# Patient Record
Sex: Female | Born: 1935 | Race: White | Hispanic: No | Smoking: Never smoker
Health system: Southern US, Community
[De-identification: ages and names within clinical notes are randomized; demographics above are authoritative.]

## PROBLEM LIST (undated history)

## (undated) DIAGNOSIS — F419 Anxiety disorder, unspecified: Secondary | ICD-10-CM

## (undated) DIAGNOSIS — E785 Hyperlipidemia, unspecified: Secondary | ICD-10-CM

## (undated) DIAGNOSIS — D649 Anemia, unspecified: Secondary | ICD-10-CM

## (undated) DIAGNOSIS — K838 Other specified diseases of biliary tract: Secondary | ICD-10-CM

## (undated) DIAGNOSIS — K8020 Calculus of gallbladder without cholecystitis without obstruction: Secondary | ICD-10-CM

## (undated) DIAGNOSIS — T380X5A Adverse effect of glucocorticoids and synthetic analogues, initial encounter: Secondary | ICD-10-CM

## (undated) DIAGNOSIS — K219 Gastro-esophageal reflux disease without esophagitis: Secondary | ICD-10-CM

## (undated) DIAGNOSIS — I10 Essential (primary) hypertension: Secondary | ICD-10-CM

## (undated) DIAGNOSIS — G4730 Sleep apnea, unspecified: Secondary | ICD-10-CM

## (undated) DIAGNOSIS — K900 Celiac disease: Secondary | ICD-10-CM

## (undated) DIAGNOSIS — H409 Unspecified glaucoma: Secondary | ICD-10-CM

## (undated) DIAGNOSIS — J449 Chronic obstructive pulmonary disease, unspecified: Secondary | ICD-10-CM

## (undated) DIAGNOSIS — F32A Depression, unspecified: Secondary | ICD-10-CM

## (undated) DIAGNOSIS — K859 Acute pancreatitis without necrosis or infection, unspecified: Secondary | ICD-10-CM

## (undated) DIAGNOSIS — M1990 Unspecified osteoarthritis, unspecified site: Secondary | ICD-10-CM

## (undated) DIAGNOSIS — F329 Major depressive disorder, single episode, unspecified: Secondary | ICD-10-CM

## (undated) DIAGNOSIS — J302 Other seasonal allergic rhinitis: Secondary | ICD-10-CM

## (undated) HISTORY — PX: APPENDECTOMY: SHX54

## (undated) HISTORY — PX: HERNIA REPAIR: SHX51

## (undated) HISTORY — DX: Hyperlipidemia, unspecified: E78.5

## (undated) HISTORY — DX: Other seasonal allergic rhinitis: J30.2

## (undated) HISTORY — PX: CATARACT EXTRACTION, BILATERAL: SHX1313

## (undated) HISTORY — DX: Adverse effect of glucocorticoids and synthetic analogues, initial encounter: T38.0X5A

## (undated) HISTORY — DX: Depression, unspecified: F32.A

## (undated) HISTORY — DX: Major depressive disorder, single episode, unspecified: F32.9

## (undated) HISTORY — PX: TOTAL HIP ARTHROPLASTY: SHX124

## (undated) HISTORY — PX: JOINT REPLACEMENT: SHX530

## (undated) HISTORY — DX: Celiac disease: K90.0

## (undated) HISTORY — DX: Gastro-esophageal reflux disease without esophagitis: K21.9

## (undated) HISTORY — DX: Essential (primary) hypertension: I10

---

## 2010-12-29 HISTORY — PX: UPPER GASTROINTESTINAL ENDOSCOPY: SHX188

## 2010-12-29 HISTORY — PX: COLONOSCOPY: SHX174

## 2013-09-10 ENCOUNTER — Ambulatory Visit (INDEPENDENT_AMBULATORY_CARE_PROVIDER_SITE_OTHER): Payer: Medicare Other | Admitting: Internal Medicine

## 2013-09-10 ENCOUNTER — Encounter (INDEPENDENT_AMBULATORY_CARE_PROVIDER_SITE_OTHER): Payer: Self-pay | Admitting: Internal Medicine

## 2013-09-10 ENCOUNTER — Other Ambulatory Visit (INDEPENDENT_AMBULATORY_CARE_PROVIDER_SITE_OTHER): Payer: Self-pay | Admitting: *Deleted

## 2013-09-10 VITALS — BP 120/70 | HR 72 | Temp 98.1°F | Resp 18 | Ht 65.0 in | Wt 121.4 lb

## 2013-09-10 DIAGNOSIS — K900 Celiac disease: Secondary | ICD-10-CM | POA: Insufficient documentation

## 2013-09-10 DIAGNOSIS — Z860100 Personal history of colon polyps, unspecified: Secondary | ICD-10-CM | POA: Insufficient documentation

## 2013-09-10 DIAGNOSIS — M810 Age-related osteoporosis without current pathological fracture: Secondary | ICD-10-CM | POA: Insufficient documentation

## 2013-09-10 DIAGNOSIS — K838 Other specified diseases of biliary tract: Secondary | ICD-10-CM

## 2013-09-10 DIAGNOSIS — K219 Gastro-esophageal reflux disease without esophagitis: Secondary | ICD-10-CM | POA: Insufficient documentation

## 2013-09-10 DIAGNOSIS — J302 Other seasonal allergic rhinitis: Secondary | ICD-10-CM | POA: Insufficient documentation

## 2013-09-10 DIAGNOSIS — I10 Essential (primary) hypertension: Secondary | ICD-10-CM | POA: Insufficient documentation

## 2013-09-10 DIAGNOSIS — E785 Hyperlipidemia, unspecified: Secondary | ICD-10-CM | POA: Insufficient documentation

## 2013-09-10 DIAGNOSIS — F32A Depression, unspecified: Secondary | ICD-10-CM | POA: Insufficient documentation

## 2013-09-10 DIAGNOSIS — F329 Major depressive disorder, single episode, unspecified: Secondary | ICD-10-CM | POA: Insufficient documentation

## 2013-09-10 DIAGNOSIS — K8020 Calculus of gallbladder without cholecystitis without obstruction: Secondary | ICD-10-CM

## 2013-09-10 NOTE — Progress Notes (Signed)
Presenting complaint;  Dilated biliary system and cholelithiasis.  History of present illness;  Patient is an 80 year old Caucasian female who is referred through courtesy of Dr. Ree KidaJack Spainhour for possible ERCP. Patient is accompanied by her daughter Lupita LeashDonna. Patient states she has not felt valve for the last 6 months. She has had daily nausea underachievement on ondansetron. She also has noted anorexia and 9 pound weight loss. Most of the time she's been force-feeding herself but she has never vomited but experienced upper abdominal pain fever chills bedtime she has noted dark orange color to her urine. She was evaluated by Dr. Sabino DickSpanhour and associates and found to have cholelithiasis and dilated CBD and CHD. She was further evaluated with MRCP which revealed markedly dilated CHD and mildly dilated CBD with focal mural thickening. No filling defects are identified. With these findings it was felt that she would need an ERCP and this visit was arranged. Patient has chronic GERD. She states her heartburn is well controlled with therapy. She has history of celiac disease and has been on gluten-free diet and has normal stools. She complains of intermittent postural lightheadedness. She felt in December 2014 and had a concussion. She was evaluated at a later date and had negative head CT. She has history of colonic polyps her last colonoscopy was 2 years ago with removal of single polyp.  Current Medications: Current Outpatient Prescriptions  Medication Sig Dispense Refill  . Aclidinium Bromide (TUDORZA PRESSAIR) 400 MCG/ACT AEPB Inhale 1 puff into the lungs 2 (two) times daily.      Marland Kitchen. albuterol (PROVENTIL HFA;VENTOLIN HFA) 108 (90 BASE) MCG/ACT inhaler Inhale into the lungs. 2 puffs four times daily as needed      . ALPRAZolam (XANAX) 1 MG tablet Take 1 mg by mouth at bedtime as needed for anxiety. Patient take 1/2 tablet 3-4 times daily      . amLODipine (NORVASC) 5 MG tablet Take 5 mg by mouth.  Patient takes 1/2 tablet in the morning , and 1/2 tablet in the pm.      . bimatoprost (LUMIGAN) 0.03 % ophthalmic solution Place 1 drop into both eyes at bedtime.      . Biotin 5000 MCG CAPS Take by mouth daily.      . Calcium Carb-Cholecalciferol (CALCIUM 600 + D) 600-200 MG-UNIT TABS Take by mouth daily.      . Cholecalciferol (VITAMIN D-3) 1000 UNITS CAPS Take by mouth daily.      . ciclesonide (OMNARIS) 50 MCG/ACT nasal spray Place 2 sprays into both nostrils daily.      Marland Kitchen. CROMOLYN SODIUM IN Inhale into the lungs 2 (two) times daily. 20 mg/422mL      . Cyanocobalamin (VITAMIN B 12 PO) Take 2,500 mcg by mouth daily.      . cycloSPORINE (RESTASIS) 0.05 % ophthalmic emulsion Place 1 drop into both eyes 2 (two) times daily.      Marland Kitchen. denosumab (PROLIA) 60 MG/ML SOLN injection Inject 60 mg into the skin every 6 (six) months. Administer in upper arm, thigh, or abdomen      . diclofenac sodium (VOLTAREN) 1 % GEL Apply topically as needed.      Marland Kitchen. esomeprazole (NEXIUM) 40 MG capsule Take 40 mg by mouth daily at 12 noon.      . Ferrous Sulfate (IRON) 325 (65 FE) MG TABS Take by mouth daily.      . Glucosamine 500 MG CAPS Take by mouth daily.      . IPRATROPIUM BROMIDE HFA  IN Inhale 17 mcg into the lungs. 2 puffs  4 times daily      . levocetirizine (XYZAL) 5 MG tablet Take 5 mg by mouth every evening.      . mirtazapine (REMERON) 15 MG tablet Take 15 mg by mouth at bedtime.      . montelukast (SINGULAIR) 10 MG tablet Take 10 mg by mouth at bedtime.      . Multiple Vitamins-Minerals (MULTI FOR HER PO) Take by mouth daily.      . Omega-3 Fatty Acids (FISH OIL) 1200 MG CAPS Take by mouth daily.      . ondansetron (ZOFRAN) 4 MG tablet Take 4 mg by mouth 3 (three) times daily.      . sennosides-docusate sodium (SENOKOT-S) 8.6-50 MG tablet Take 1 tablet by mouth. Patient states that she takes 3- 6 per night      . traMADol-acetaminophen (ULTRACET) 37.5-325 MG per tablet Take 2 tablets by mouth every 6 (six)  hours as needed.       No current facility-administered medications for this visit.   Past Medical History  Diagnosis Date  . Hypertension   . Celiac disease   . Seasonal allergies   . GERD (gastroesophageal reflux disease)   . Hyperlipemia   . Depression   . Dexamethasone adverse reaction         osteoporosis.  Past Surgical History  Procedure Laterality Date  . Colonoscopy  12/29/2010  . Upper gastrointestinal endoscopy  12/29/10  . Total hip arthroplasty  04/13/10,2008  . Appendectomy    . Hernia repair     Allergies; Allergies  Allergen Reactions  . Codeine   . Levaquin [Levofloxacin In D5w]   . Septra [Sulfamethoxazole-Tmp Ds]    Social history; She is widowed. She lives alone. She does not smoke cigarettes or drink alcohol. She has 4 children. Her son and daughter also have celiac disease.  Family history; Mother had colonic polyps and died in her 78s. Father had heart problems and lived to be 43. She had one brother who died at age 63.   Objective: Blood pressure 120/70, pulse 72, temperature 98.1 F (36.7 C), temperature source Oral, resp. rate 18, height 5\' 5"  (1.651 m), weight 121 lb 6.4 oz (55.067 kg). Well-developed thin Caucasian female who is in NAD. Conjunctiva is pink. Sclera is nonicteric Oropharyngeal mucosa is normal. No neck masses or thyromegaly noted. Cardiac exam with regular rhythm normal S1 and S2. No murmur or gallop noted. Lungs are clear to auscultation. Abdomen is flat. Bowel sounds are normal. On palpation is soft and nontender without organomegaly or masses.  No LE edema or clubbing noted.  Labs/studies Results: Lab data from 08/31/2013. WBC 4.9, H&H 12.8 and 38.3 and platelet count 186K. Lab data from 09/08/2013. Bilirubin 0.8, BP 38, AST 28, ALT 28, total protein 6.4 and albumin 3.7. Serum sodium 138, potassium 5.1, for right 103, CO2 29, BUN 23, creatinine 1.10 and serum calcium 9.0.  Abdominal pelvic CT from  09/04/2013. Multiple gallstones. Dilated CHD and CBD down to the level of ampulla no filling defects evident. Extensive diverticula and sigmoid colon.  MRCP on 09/09/2013 Proximal common bile duct measured 1.64 centimeter and distally it measured 0.8 cm the transition noted and mid segments. There is focal mural thickening at this level. Mural mass or cholangiocarcinoma cannot be ruled out. I have reviewed MRCP films. There is questionable defect in the distal CBD. Will review this study with the radiologist.  Assessment:   Patient  is a pleasant 80 year old Caucasian female who presents with several month history of nausea without vomiting, anorexia and weight loss. On workup she was found to have dilated biliary system and gallbladder containing multiple small stones. She has never experienced abdominal pain fever or chills.  Dilated biliary system would appear to be secondary to choledocholithiasis. Either she has very small stones not evident on MRCP or she may be passing stones intermittently. There is questionable wall thickening in mid section of CBD which may have to be evaluated with cholangioscopy or Spyglass examination. I very much doubt that we are dealing with neoplasm. She could also have papillary stenosis. She should also consider cholecystectomy bile duct evaluation has been completed. Patient's nausea has improved with ondansetron. Some of her nausea may be manifestation of underlying stress disorder and depression.   Recommendations;  ERCP with sphincterotomy and Spyglass examination of bile duct if indicated. I have reviewed the procedure and risks with the patient and her daughter Lupita Leash and they're both agreeable to proceed with this study ASAP.  Patient will have INR and comprehensive chemistry panel prior to ERCP.

## 2013-09-10 NOTE — Patient Instructions (Signed)
ERCP to be scheduled

## 2013-09-11 ENCOUNTER — Other Ambulatory Visit: Payer: Self-pay

## 2013-09-11 ENCOUNTER — Ambulatory Visit (HOSPITAL_COMMUNITY)
Admission: RE | Admit: 2013-09-11 | Discharge: 2013-09-11 | Disposition: A | Discharge status: Home / Self Care | Payer: Medicare Other | Source: Ambulatory Visit | Attending: Internal Medicine | Admitting: Internal Medicine

## 2013-09-11 ENCOUNTER — Ambulatory Visit (HOSPITAL_COMMUNITY): Payer: Medicare Other | Admitting: Anesthesiology

## 2013-09-11 ENCOUNTER — Ambulatory Visit (HOSPITAL_COMMUNITY): Payer: Medicare Other

## 2013-09-11 ENCOUNTER — Encounter (HOSPITAL_COMMUNITY): Payer: Medicare Other | Admitting: Anesthesiology

## 2013-09-11 ENCOUNTER — Encounter (HOSPITAL_COMMUNITY): Payer: Self-pay

## 2013-09-11 ENCOUNTER — Encounter (HOSPITAL_COMMUNITY): Payer: Self-pay | Admitting: Emergency Medicine

## 2013-09-11 ENCOUNTER — Emergency Department (HOSPITAL_COMMUNITY): Payer: Medicare Other

## 2013-09-11 ENCOUNTER — Encounter (HOSPITAL_COMMUNITY)
Admission: RE | Disposition: A | Discharge status: Home / Self Care | Payer: Self-pay | Source: Ambulatory Visit | Attending: Internal Medicine

## 2013-09-11 ENCOUNTER — Inpatient Hospital Stay (HOSPITAL_COMMUNITY)
Admission: EM | Admit: 2013-09-11 | Discharge: 2013-09-14 | DRG: 440 | Disposition: A | Discharge status: Home / Self Care | Payer: Medicare Other | Attending: Internal Medicine | Admitting: Internal Medicine

## 2013-09-11 DIAGNOSIS — I10 Essential (primary) hypertension: Secondary | ICD-10-CM | POA: Diagnosis not present

## 2013-09-11 DIAGNOSIS — M129 Arthropathy, unspecified: Secondary | ICD-10-CM | POA: Diagnosis present

## 2013-09-11 DIAGNOSIS — K900 Celiac disease: Secondary | ICD-10-CM

## 2013-09-11 DIAGNOSIS — M810 Age-related osteoporosis without current pathological fracture: Secondary | ICD-10-CM

## 2013-09-11 DIAGNOSIS — J449 Chronic obstructive pulmonary disease, unspecified: Secondary | ICD-10-CM | POA: Diagnosis present

## 2013-09-11 DIAGNOSIS — F419 Anxiety disorder, unspecified: Secondary | ICD-10-CM

## 2013-09-11 DIAGNOSIS — J4489 Other specified chronic obstructive pulmonary disease: Secondary | ICD-10-CM | POA: Diagnosis present

## 2013-09-11 DIAGNOSIS — Z8349 Family history of other endocrine, nutritional and metabolic diseases: Secondary | ICD-10-CM

## 2013-09-11 DIAGNOSIS — Z820 Family history of epilepsy and other diseases of the nervous system: Secondary | ICD-10-CM

## 2013-09-11 DIAGNOSIS — F32A Depression, unspecified: Secondary | ICD-10-CM

## 2013-09-11 DIAGNOSIS — H409 Unspecified glaucoma: Secondary | ICD-10-CM | POA: Diagnosis present

## 2013-09-11 DIAGNOSIS — K5900 Constipation, unspecified: Secondary | ICD-10-CM | POA: Diagnosis present

## 2013-09-11 DIAGNOSIS — K838 Other specified diseases of biliary tract: Secondary | ICD-10-CM

## 2013-09-11 DIAGNOSIS — J302 Other seasonal allergic rhinitis: Secondary | ICD-10-CM

## 2013-09-11 DIAGNOSIS — Z96649 Presence of unspecified artificial hip joint: Secondary | ICD-10-CM

## 2013-09-11 DIAGNOSIS — K219 Gastro-esophageal reflux disease without esophagitis: Secondary | ICD-10-CM

## 2013-09-11 DIAGNOSIS — K859 Acute pancreatitis without necrosis or infection, unspecified: Secondary | ICD-10-CM | POA: Diagnosis not present

## 2013-09-11 DIAGNOSIS — Z860100 Personal history of colon polyps, unspecified: Secondary | ICD-10-CM

## 2013-09-11 DIAGNOSIS — F329 Major depressive disorder, single episode, unspecified: Secondary | ICD-10-CM | POA: Diagnosis present

## 2013-09-11 DIAGNOSIS — Z9889 Other specified postprocedural states: Secondary | ICD-10-CM

## 2013-09-11 DIAGNOSIS — E785 Hyperlipidemia, unspecified: Secondary | ICD-10-CM

## 2013-09-11 DIAGNOSIS — F3289 Other specified depressive episodes: Secondary | ICD-10-CM | POA: Diagnosis present

## 2013-09-11 DIAGNOSIS — Z8249 Family history of ischemic heart disease and other diseases of the circulatory system: Secondary | ICD-10-CM

## 2013-09-11 DIAGNOSIS — F411 Generalized anxiety disorder: Secondary | ICD-10-CM | POA: Diagnosis present

## 2013-09-11 DIAGNOSIS — G4730 Sleep apnea, unspecified: Secondary | ICD-10-CM | POA: Diagnosis present

## 2013-09-11 DIAGNOSIS — K8020 Calculus of gallbladder without cholecystitis without obstruction: Secondary | ICD-10-CM

## 2013-09-11 DIAGNOSIS — Z79899 Other long term (current) drug therapy: Secondary | ICD-10-CM

## 2013-09-11 HISTORY — PX: SPYGLASS CHOLANGIOSCOPY: SHX5441

## 2013-09-11 HISTORY — PX: ERCP W/ SPHICTEROTOMY: SHX1523

## 2013-09-11 HISTORY — DX: Sleep apnea, unspecified: G47.30

## 2013-09-11 HISTORY — DX: Unspecified osteoarthritis, unspecified site: M19.90

## 2013-09-11 HISTORY — DX: Chronic obstructive pulmonary disease, unspecified: J44.9

## 2013-09-11 HISTORY — DX: Other specified diseases of biliary tract: K83.8

## 2013-09-11 HISTORY — DX: Anemia, unspecified: D64.9

## 2013-09-11 HISTORY — DX: Calculus of gallbladder without cholecystitis without obstruction: K80.20

## 2013-09-11 HISTORY — PX: SPHINCTEROTOMY: SHX5544

## 2013-09-11 HISTORY — PX: BALLOON DILATION: SHX5330

## 2013-09-11 HISTORY — PX: ERCP: SHX5425

## 2013-09-11 HISTORY — PX: BILIARY STENT PLACEMENT: SHX5538

## 2013-09-11 HISTORY — DX: Anxiety disorder, unspecified: F41.9

## 2013-09-11 HISTORY — DX: Unspecified glaucoma: H40.9

## 2013-09-11 HISTORY — PX: PANCREATIC STENT PLACEMENT: SHX5539

## 2013-09-11 LAB — CBC WITH DIFFERENTIAL/PLATELET
BASOS ABS: 0 10*3/uL (ref 0.0–0.1)
Basophils Relative: 0 % (ref 0–1)
EOS ABS: 0 10*3/uL (ref 0.0–0.7)
Eosinophils Relative: 0 % (ref 0–5)
HCT: 37.8 % (ref 36.0–46.0)
HEMOGLOBIN: 12.7 g/dL (ref 12.0–15.0)
Lymphocytes Relative: 9 % — ABNORMAL LOW (ref 12–46)
Lymphs Abs: 0.9 10*3/uL (ref 0.7–4.0)
MCH: 33.3 pg (ref 26.0–34.0)
MCHC: 33.6 g/dL (ref 30.0–36.0)
MCV: 99.2 fL (ref 78.0–100.0)
MONOS PCT: 5 % (ref 3–12)
Monocytes Absolute: 0.5 10*3/uL (ref 0.1–1.0)
NEUTROS ABS: 8.2 10*3/uL — AB (ref 1.7–7.7)
Neutrophils Relative %: 86 % — ABNORMAL HIGH (ref 43–77)
PLATELETS: 178 10*3/uL (ref 150–400)
RBC: 3.81 MIL/uL — ABNORMAL LOW (ref 3.87–5.11)
RDW: 13.5 % (ref 11.5–15.5)
WBC: 9.6 10*3/uL (ref 4.0–10.5)

## 2013-09-11 LAB — URINALYSIS, ROUTINE W REFLEX MICROSCOPIC
BILIRUBIN URINE: NEGATIVE
GLUCOSE, UA: NEGATIVE mg/dL
Hgb urine dipstick: NEGATIVE
KETONES UR: NEGATIVE mg/dL
LEUKOCYTES UA: NEGATIVE
NITRITE: NEGATIVE
PH: 7.5 (ref 5.0–8.0)
Protein, ur: NEGATIVE mg/dL
Specific Gravity, Urine: 1.015 (ref 1.005–1.030)
Urobilinogen, UA: 0.2 mg/dL (ref 0.0–1.0)

## 2013-09-11 LAB — COMPREHENSIVE METABOLIC PANEL
ALK PHOS: 47 U/L (ref 39–117)
ALT: 29 U/L (ref 0–35)
ALT: 28 U/L (ref 0–35)
AST: 30 U/L (ref 0–37)
AST: 32 U/L (ref 0–37)
Albumin: 3.5 g/dL (ref 3.5–5.2)
Albumin: 3.5 g/dL (ref 3.5–5.2)
Alkaline Phosphatase: 47 U/L (ref 39–117)
BILIRUBIN TOTAL: 0.5 mg/dL (ref 0.3–1.2)
BUN: 24 mg/dL — ABNORMAL HIGH (ref 6–23)
BUN: 22 mg/dL (ref 6–23)
CHLORIDE: 99 meq/L (ref 96–112)
CO2: 31 mEq/L (ref 19–32)
CO2: 28 mEq/L (ref 19–32)
Calcium: 9.5 mg/dL (ref 8.4–10.5)
Calcium: 9.4 mg/dL (ref 8.4–10.5)
Chloride: 107 mEq/L (ref 96–112)
Creatinine, Ser: 0.9 mg/dL (ref 0.50–1.10)
Creatinine, Ser: 0.74 mg/dL (ref 0.50–1.10)
GFR calc Af Amer: 67 mL/min — ABNORMAL LOW (ref >90)
GFR calc Af Amer: 89 mL/min — ABNORMAL LOW (ref >90)
GFR calc non Af Amer: 58 mL/min — ABNORMAL LOW (ref >90)
GFR calc non Af Amer: 77 mL/min — ABNORMAL LOW (ref >90)
GLUCOSE: 88 mg/dL (ref 70–99)
Glucose, Bld: 122 mg/dL — ABNORMAL HIGH (ref 70–99)
POTASSIUM: 4.4 meq/L (ref 3.7–5.3)
POTASSIUM: 4.1 meq/L (ref 3.7–5.3)
SODIUM: 147 meq/L (ref 137–147)
SODIUM: 139 meq/L (ref 137–147)
TOTAL PROTEIN: 6.8 g/dL (ref 6.0–8.3)
Total Bilirubin: 0.3 mg/dL (ref 0.3–1.2)
Total Protein: 6.9 g/dL (ref 6.0–8.3)

## 2013-09-11 LAB — CBC
HCT: 38.3 % (ref 36.0–46.0)
HEMOGLOBIN: 12.7 g/dL (ref 12.0–15.0)
MCH: 33.2 pg (ref 26.0–34.0)
MCHC: 33.2 g/dL (ref 30.0–36.0)
MCV: 100 fL (ref 78.0–100.0)
Platelets: 161 10*3/uL (ref 150–400)
RBC: 3.83 MIL/uL — ABNORMAL LOW (ref 3.87–5.11)
RDW: 13.6 % (ref 11.5–15.5)
WBC: 4.2 10*3/uL (ref 4.0–10.5)

## 2013-09-11 LAB — PROTIME-INR
INR: 0.9 (ref 0.00–1.49)
Prothrombin Time: 12 seconds (ref 11.6–15.2)

## 2013-09-11 LAB — LIPASE, BLOOD: Lipase: 624 U/L — ABNORMAL HIGH (ref 11–59)

## 2013-09-11 SURGERY — ERCP, WITH INTERVENTION IF INDICATED
Anesthesia: General

## 2013-09-11 MED ORDER — CEFAZOLIN SODIUM 1-5 GM-% IV SOLN
1.0000 g | Freq: Three times a day (TID) | INTRAVENOUS | Status: DC
  Administered 2013-09-11: 1 g via INTRAVENOUS
  Filled 2013-09-11: qty 50

## 2013-09-11 MED ORDER — GLYCOPYRROLATE 0.2 MG/ML IJ SOLN
0.2000 mg | Freq: Once | INTRAMUSCULAR | Status: AC
  Administered 2013-09-11: 0.2 mg via INTRAVENOUS

## 2013-09-11 MED ORDER — FAMOTIDINE IN NACL 20-0.9 MG/50ML-% IV SOLN
20.0000 mg | Freq: Once | INTRAVENOUS | Status: AC
  Administered 2013-09-11: 20 mg via INTRAVENOUS
  Filled 2013-09-11: qty 50

## 2013-09-11 MED ORDER — GLYCOPYRROLATE 0.2 MG/ML IJ SOLN
INTRAMUSCULAR | Status: DC | PRN
  Administered 2013-09-11: 0.6 mg via INTRAVENOUS

## 2013-09-11 MED ORDER — ETOMIDATE 2 MG/ML IV SOLN
INTRAVENOUS | Status: AC
  Filled 2013-09-11: qty 10

## 2013-09-11 MED ORDER — CROMOLYN SODIUM 20 MG/2ML IN NEBU
20.0000 mg | INHALATION_SOLUTION | Freq: Three times a day (TID) | RESPIRATORY_TRACT | Status: DC
  Administered 2013-09-12 – 2013-09-14 (×7): 20 mg via RESPIRATORY_TRACT
  Filled 2013-09-11 (×15): qty 2

## 2013-09-11 MED ORDER — SODIUM CHLORIDE 0.9 % IN NEBU
INHALATION_SOLUTION | RESPIRATORY_TRACT | Status: AC
  Filled 2013-09-11: qty 3

## 2013-09-11 MED ORDER — SODIUM CHLORIDE 0.9 % IV SOLN
INTRAVENOUS | Status: DC
  Administered 2013-09-11 – 2013-09-13 (×5): via INTRAVENOUS

## 2013-09-11 MED ORDER — FENTANYL CITRATE 0.05 MG/ML IJ SOLN: 25.0000 ug | INTRAMUSCULAR | Status: DC | PRN

## 2013-09-11 MED ORDER — MIDAZOLAM HCL 2 MG/2ML IJ SOLN
INTRAMUSCULAR | Status: AC
  Filled 2013-09-11: qty 2

## 2013-09-11 MED ORDER — DEXAMETHASONE SODIUM PHOSPHATE 4 MG/ML IJ SOLN
4.0000 mg | Freq: Once | INTRAMUSCULAR | Status: AC
Start: 2013-09-11 — End: 2013-09-11
  Administered 2013-09-11: 4 mg via INTRAVENOUS

## 2013-09-11 MED ORDER — ONDANSETRON HCL 4 MG/2ML IJ SOLN
4.0000 mg | Freq: Once | INTRAMUSCULAR | Status: AC
  Administered 2013-09-11: 4 mg via INTRAVENOUS

## 2013-09-11 MED ORDER — MORPHINE SULFATE 2 MG/ML IJ SOLN
2.0000 mg | INTRAMUSCULAR | Status: DC | PRN
  Administered 2013-09-11 – 2013-09-13 (×9): 2 mg via INTRAVENOUS
  Filled 2013-09-11 (×9): qty 1

## 2013-09-11 MED ORDER — CYCLOSPORINE 0.05 % OP EMUL
1.0000 [drp] | Freq: Two times a day (BID) | OPHTHALMIC | Status: DC
  Administered 2013-09-11 – 2013-09-14 (×6): 1 [drp] via OPHTHALMIC
  Filled 2013-09-11 (×10): qty 1

## 2013-09-11 MED ORDER — IPRATROPIUM BROMIDE HFA 17 MCG/ACT IN AERS: 2.0000 | INHALATION_SPRAY | RESPIRATORY_TRACT | Status: DC

## 2013-09-11 MED ORDER — FENTANYL CITRATE 0.05 MG/ML IJ SOLN
25.0000 ug | INTRAMUSCULAR | Status: AC | PRN
  Administered 2013-09-11 (×2): 25 ug via INTRAVENOUS
  Filled 2013-09-11: qty 2

## 2013-09-11 MED ORDER — PHENYLEPHRINE HCL 10 MG/ML IJ SOLN
INTRAMUSCULAR | Status: DC | PRN
  Administered 2013-09-11: 100 ug via INTRAVENOUS

## 2013-09-11 MED ORDER — SODIUM CHLORIDE 0.9 % IV SOLN
INTRAVENOUS | Status: AC
  Filled 2013-09-11: qty 50

## 2013-09-11 MED ORDER — LACTATED RINGERS IV SOLN
INTRAVENOUS | Status: DC
  Administered 2013-09-11: 1000 mL via INTRAVENOUS

## 2013-09-11 MED ORDER — CEFAZOLIN SODIUM-DEXTROSE 2-3 GM-% IV SOLR
INTRAVENOUS | Status: DC | PRN
  Administered 2013-09-11: 2 g via INTRAVENOUS

## 2013-09-11 MED ORDER — GLUCAGON HCL (RDNA) 1 MG IJ SOLR
INTRAMUSCULAR | Status: AC
  Administered 2013-09-11 (×5): 0.25 mg via INTRAVENOUS
  Filled 2013-09-11: qty 2

## 2013-09-11 MED ORDER — FENTANYL CITRATE 0.05 MG/ML IJ SOLN
INTRAMUSCULAR | Status: DC | PRN
  Administered 2013-09-11 (×3): 50 ug via INTRAVENOUS

## 2013-09-11 MED ORDER — CEFAZOLIN SODIUM 1-5 GM-% IV SOLN
1.0000 g | Freq: Three times a day (TID) | INTRAVENOUS | Status: DC
  Administered 2013-09-12 – 2013-09-13 (×4): 1 g via INTRAVENOUS
  Filled 2013-09-11 (×8): qty 50

## 2013-09-11 MED ORDER — ROCURONIUM BROMIDE 50 MG/5ML IV SOLN
INTRAVENOUS | Status: AC
  Filled 2013-09-11: qty 1

## 2013-09-11 MED ORDER — HEPARIN SODIUM (PORCINE) 5000 UNIT/ML IJ SOLN
5000.0000 [IU] | Freq: Three times a day (TID) | INTRAMUSCULAR | Status: DC
  Administered 2013-09-11 – 2013-09-12 (×2): 5000 [IU] via SUBCUTANEOUS
  Filled 2013-09-11 (×2): qty 1

## 2013-09-11 MED ORDER — PHENYLEPHRINE HCL 10 MG/ML IJ SOLN
INTRAMUSCULAR | Status: AC
  Filled 2013-09-11: qty 1

## 2013-09-11 MED ORDER — STERILE WATER FOR IRRIGATION IR SOLN
Status: DC | PRN
  Administered 2013-09-11: 10:00:00

## 2013-09-11 MED ORDER — ETOMIDATE 2 MG/ML IV SOLN
INTRAVENOUS | Status: DC | PRN
  Administered 2013-09-11: 10 mg via INTRAVENOUS

## 2013-09-11 MED ORDER — CEFAZOLIN SODIUM-DEXTROSE 2-3 GM-% IV SOLR
2.0000 g | Freq: Once | INTRAVENOUS | Status: DC
  Filled 2013-09-11 (×2): qty 50

## 2013-09-11 MED ORDER — ACETYLCYSTEINE 20 % IN SOLN
RESPIRATORY_TRACT | Status: DC | PRN
  Administered 2013-09-11: 4 mL via ORAL

## 2013-09-11 MED ORDER — CEFAZOLIN SODIUM 1-5 GM-% IV SOLN
INTRAVENOUS | Status: AC
  Filled 2013-09-11: qty 50

## 2013-09-11 MED ORDER — GLYCOPYRROLATE 0.2 MG/ML IJ SOLN
INTRAMUSCULAR | Status: AC
  Filled 2013-09-11: qty 3

## 2013-09-11 MED ORDER — ALBUTEROL SULFATE (2.5 MG/3ML) 0.083% IN NEBU
2.5000 mg | INHALATION_SOLUTION | Freq: Once | RESPIRATORY_TRACT | Status: AC
  Administered 2013-09-11: 2.5 mg via RESPIRATORY_TRACT

## 2013-09-11 MED ORDER — ACETYLCYSTEINE 20 % IN SOLN
RESPIRATORY_TRACT | Status: AC
  Filled 2013-09-11: qty 4

## 2013-09-11 MED ORDER — SODIUM CHLORIDE BACTERIOSTATIC 0.9 % IJ SOLN
INTRAMUSCULAR | Status: AC
  Filled 2013-09-11: qty 20

## 2013-09-11 MED ORDER — LACTATED RINGERS IV SOLN
INTRAVENOUS | Status: DC | PRN
  Administered 2013-09-11 (×2): via INTRAVENOUS

## 2013-09-11 MED ORDER — IPRATROPIUM BROMIDE 0.02 % IN SOLN
0.5000 mg | RESPIRATORY_TRACT | Status: DC
  Administered 2013-09-11: 0.5 mg via RESPIRATORY_TRACT
  Filled 2013-09-11: qty 2.5

## 2013-09-11 MED ORDER — ALBUTEROL SULFATE (2.5 MG/3ML) 0.083% IN NEBU: 2.5000 mg | INHALATION_SOLUTION | Freq: Once | RESPIRATORY_TRACT | Status: DC

## 2013-09-11 MED ORDER — ONDANSETRON HCL 4 MG/2ML IJ SOLN
4.0000 mg | INTRAMUSCULAR | Status: AC | PRN
  Administered 2013-09-11 (×2): 4 mg via INTRAVENOUS
  Filled 2013-09-11 (×3): qty 2

## 2013-09-11 MED ORDER — FENTANYL CITRATE 0.05 MG/ML IJ SOLN
INTRAMUSCULAR | Status: AC
  Filled 2013-09-11: qty 5

## 2013-09-11 MED ORDER — ONDANSETRON HCL 4 MG/2ML IJ SOLN: 4.0000 mg | Freq: Once | INTRAMUSCULAR | Status: DC | PRN

## 2013-09-11 MED ORDER — EPHEDRINE SULFATE 50 MG/ML IJ SOLN
INTRAMUSCULAR | Status: AC
  Filled 2013-09-11: qty 1

## 2013-09-11 MED ORDER — NEOSTIGMINE METHYLSULFATE 1 MG/ML IJ SOLN
INTRAMUSCULAR | Status: DC | PRN
  Administered 2013-09-11: 4 mg via INTRAVENOUS

## 2013-09-11 MED ORDER — LIDOCAINE HCL (CARDIAC) 20 MG/ML IV SOLN
INTRAVENOUS | Status: DC | PRN
  Administered 2013-09-11: 50 mg via INTRAVENOUS

## 2013-09-11 MED ORDER — DEXAMETHASONE SODIUM PHOSPHATE 4 MG/ML IJ SOLN
INTRAMUSCULAR | Status: AC
Start: 2013-09-11 — End: 2013-09-11
  Filled 2013-09-11: qty 1

## 2013-09-11 MED ORDER — MIDAZOLAM HCL 2 MG/2ML IJ SOLN
1.0000 mg | INTRAMUSCULAR | Status: DC | PRN
  Administered 2013-09-11 (×2): 2 mg via INTRAVENOUS
  Filled 2013-09-11: qty 2

## 2013-09-11 MED ORDER — ROCURONIUM BROMIDE 100 MG/10ML IV SOLN
INTRAVENOUS | Status: DC | PRN
  Administered 2013-09-11: 30 mg via INTRAVENOUS

## 2013-09-11 MED ORDER — SODIUM CHLORIDE 0.9 % IV SOLN
INTRAVENOUS | Status: DC
  Administered 2013-09-11: 75 mL/h via INTRAVENOUS

## 2013-09-11 MED ORDER — SODIUM CHLORIDE 0.9 % IV SOLN
INTRAVENOUS | Status: DC | PRN
  Administered 2013-09-11: 10:00:00

## 2013-09-11 MED ORDER — GLYCOPYRROLATE 0.2 MG/ML IJ SOLN
INTRAMUSCULAR | Status: AC
  Filled 2013-09-11: qty 1

## 2013-09-11 MED ORDER — ALBUTEROL SULFATE (2.5 MG/3ML) 0.083% IN NEBU
INHALATION_SOLUTION | RESPIRATORY_TRACT | Status: AC
  Filled 2013-09-11: qty 3

## 2013-09-11 MED ORDER — LORAZEPAM 2 MG/ML IJ SOLN
0.5000 mg | Freq: Once | INTRAMUSCULAR | Status: AC
  Administered 2013-09-11: 0.5 mg via INTRAVENOUS
  Filled 2013-09-11: qty 1

## 2013-09-11 MED ORDER — LIDOCAINE HCL (PF) 1 % IJ SOLN
INTRAMUSCULAR | Status: AC
  Filled 2013-09-11: qty 5

## 2013-09-11 MED ORDER — ONDANSETRON HCL 4 MG/2ML IJ SOLN
INTRAMUSCULAR | Status: AC
  Filled 2013-09-11: qty 2

## 2013-09-11 SURGICAL SUPPLY — 36 items
BAG HAMPER (MISCELLANEOUS) ×3 IMPLANT
BALLN RETRIEVAL 12X15 (BALLOONS) ×2 IMPLANT
BALLN RETRIEVAL 12X15MM (BALLOONS) ×1
BASKET TRAPEZOID 3X6 (MISCELLANEOUS) IMPLANT
CATH ACCESS DELIVERY (CATHETERS) ×3 IMPLANT
DEVICE INFLATION ENCORE 26 (MISCELLANEOUS) IMPLANT
DEVICE LOCKING W-BIOPSY CAP (MISCELLANEOUS) ×3 IMPLANT
FIBER LASER SLIMLINE GI 365 (MISCELLANEOUS) IMPLANT
FORCEPS BIOP RJ4 240 HOT (CUTTING FORCEPS) IMPLANT
FORCEPS BIOP SPYBITE 1.2X286 (FORCEP) ×3 IMPLANT
GUIDEWIRE HYDRA JAGWIRE .35 (WIRE) ×3 IMPLANT
GUIDEWIRE JAG HINI 025X260CM (WIRE) IMPLANT
KIT CLEAN ENDO COMPLIANCE (KITS) ×3 IMPLANT
KIT ROOM TURNOVER APOR (KITS) ×3 IMPLANT
LUBRICANT JELLY 4.5OZ STERILE (MISCELLANEOUS) ×3 IMPLANT
NEEDLE HYPO 18GX1.5 BLUNT FILL (NEEDLE) ×3 IMPLANT
NS IRRIG 500ML POUR BTL (IV SOLUTION) ×3 IMPLANT
PAD ARMBOARD 7.5X6 YLW CONV (MISCELLANEOUS) ×6 IMPLANT
PATHFINDER 450CM 0.18 (STENTS) IMPLANT
POSITIONER HEAD 8X9X4 ADT (SOFTGOODS) IMPLANT
PROBE VISUALIZATION SPYGLASS (PROBE) ×3 IMPLANT
SNARE ROTATE MED OVAL 20MM (MISCELLANEOUS) IMPLANT
SNARE SHORT THROW 13M SML OVAL (MISCELLANEOUS) ×3 IMPLANT
SPHINCTEROTOME AUTOTOME .25 (MISCELLANEOUS) ×3 IMPLANT
SPHINCTEROTOME HYDRATOME 44 (MISCELLANEOUS) ×6 IMPLANT
SPONGE GAUZE 4X4 12PLY (GAUZE/BANDAGES/DRESSINGS) ×3 IMPLANT
STENT PANCREATIC PIGTAIL 4FX5 (Stent) ×3 IMPLANT
STENT RX PRELOADED 10FRX9CM (Stent) ×3 IMPLANT
SYR 3ML LL SCALE MARK (SYRINGE) ×3 IMPLANT
SYR 50ML LL SCALE MARK (SYRINGE) ×6 IMPLANT
SYSTEM CONTINUOUS INJECTION (MISCELLANEOUS) ×3 IMPLANT
TUBE IRRIGATION (IRRIGATION / IRRIGATOR) ×3 IMPLANT
TUBING ENDO SMARTCAP PENTAX (MISCELLANEOUS) ×3 IMPLANT
WALLSTENT METAL COVERED 10X60 (STENTS) IMPLANT
WALLSTENT METAL COVERED 10X80 (STENTS) IMPLANT
WATER STERILE IRR 1000ML POUR (IV SOLUTION) ×6 IMPLANT

## 2013-09-11 NOTE — ED Notes (Signed)
Patient ambulatory to restroom with steady gait, clean catch instructions given and advised pt to bring specimen back to room as well.  

## 2013-09-11 NOTE — Anesthesia Postprocedure Evaluation (Signed)
  Anesthesia Post-op Note  Patient: Linda Adkins  Procedure(s) Performed: Procedure(s): ENDOSCOPIC RETROGRADE CHOLANGIOPANCREATOGRAPHY (ERCP) (N/A) SPHINCTEROTOMY (N/A) SPYGLASS CHOLANGIOSCOPY (N/A) REMOVAL OF STONES (N/A) PANCREATIC STENT PLACEMENT (N/A) BILIARY STENT PLACEMENT (N/A)  Patient Location: PACU  Anesthesia Type:General  Level of Consciousness: awake, alert , oriented and patient cooperative  Airway and Oxygen Therapy: Patient Spontanous Breathing and Patient connected to face mask oxygen  Post-op Pain: none  Post-op Assessment: Post-op Vital signs reviewed, Patient's Cardiovascular Status Stable, Respiratory Function Stable, Patent Airway, No signs of Nausea or vomiting and Pain level controlled  Post-op Vital Signs: Reviewed and stable  Complications: No apparent anesthesia complications

## 2013-09-11 NOTE — ED Provider Notes (Signed)
CSN: [id number]     Arrival date & time 09/11/13  1734 History   First MD Initiated Contact with Patient 09/11/13 1937     Chief Complaint  Patient presents with  . Abdominal Pain     HPI Pt was seen at 1945. Per pt, c/o gradual onset and worsening of persistent upper abd "pain" since this morning. Pt states her pain started after she had an ERCP this morning. Describes the pain as "like when I had an ulcer" and radiating into her mid-back. Pt states she has had N/V "all day," and been unable to tolerate anything by mouth. Family states pt is "due to get her gallbladder out" in the next several days. Denies CP/palpitations, no SOB/cough, no diarrhea, no black or blood in stools or emesis, no fevers.     Past Medical History  Diagnosis Date  . Hypertension   . Celiac disease   . Seasonal allergies   . GERD (gastroesophageal reflux disease)   . Hyperlipemia   . Depression   . Dexamethasone adverse reaction   . Glaucoma   . COPD (chronic obstructive pulmonary disease)   . Sleep apnea     o2 at night. No CPAP.  Marland Kitchen Arthritis   . Anemia    Past Surgical History  Procedure Laterality Date  . Colonoscopy  12/29/2010  . Upper gastrointestinal endoscopy  12/29/10  . Total hip arthroplasty Bilateral 04/13/10,2008  . Appendectomy    . Hernia repair      umbilical  . Cataract extraction, bilateral    . Joint replacement     Family History  Problem Relation Age of Onset  . Alzheimer's disease Mother   . Heart disease Father   . Lupus Brother   . Fibromyalgia Daughter   . Cystic fibrosis Daughter   . Healthy Daughter   . Fibromyalgia Son    History  Substance Use Topics  . Smoking status: Never Smoker   . Smokeless tobacco: Never Used  . Alcohol Use: No    Review of Systems ROS: Statement: All systems negative except as marked or noted in the HPI; Constitutional: Negative for fever and chills. ; ; Eyes: Negative for eye pain, redness and discharge. ; ; ENMT: Negative for ear  pain, hoarseness, nasal congestion, sinus pressure and sore throat. ; ; Cardiovascular: Negative for chest pain, palpitations, diaphoresis, dyspnea and peripheral edema. ; ; Respiratory: Negative for cough, wheezing and stridor. ; ; Gastrointestinal: +N/V, abd pain. Negative for diarrhea, blood in stool, hematemesis, jaundice and rectal bleeding. . ; ; Genitourinary: Negative for dysuria, flank pain and hematuria. ; ; Musculoskeletal: Negative for back pain and neck pain. Negative for swelling and trauma.; ; Skin: Negative for pruritus, rash, abrasions, blisters, bruising and skin lesion.; ; Neuro: Negative for headache, lightheadedness and neck stiffness. Negative for weakness, altered level of consciousness , altered mental status, extremity weakness, paresthesias, involuntary movement, seizure and syncope.     Allergies  Codeine; Levaquin; and Septra  Home Medications   Current Outpatient Rx  Name  Route  Sig  Dispense  Refill  . Aclidinium Bromide (TUDORZA PRESSAIR) 400 MCG/ACT AEPB   Inhalation   Inhale 1 puff into the lungs 2 (two) times daily.         Marland Kitchen ALPRAZolam (XANAX) 1 MG tablet   Oral   Take 0.5 mg by mouth 3 (three) times daily as needed for anxiety. Patient take 1/2 tablet 3-4 times daily         .  amLODipine (NORVASC) 5 MG tablet   Oral   Take 2.5 mg by mouth 2 (two) times daily. Patient takes 1/2 tablet in the morning , and 1/2 tablet in the pm.         . bimatoprost (LUMIGAN) 0.03 % ophthalmic solution   Both Eyes   Place 1 drop into both eyes at bedtime.         . Biotin 5000 MCG CAPS   Oral   Take by mouth daily.         . Calcium Carb-Cholecalciferol (CALCIUM 600 + D) 600-200 MG-UNIT TABS   Oral   Take 1 tablet by mouth daily.          . Cholecalciferol (VITAMIN D-3) 1000 UNITS CAPS   Oral   Take by mouth daily.         . ciclesonide (OMNARIS) 50 MCG/ACT nasal spray   Each Nare   Place 2 sprays into both nostrils daily.         Marland Kitchen. CROMOLYN  SODIUM IN   Inhalation   Inhale into the lungs 2 (two) times daily. 20 mg/742mL         . Cyanocobalamin (VITAMIN B 12 PO)   Oral   Take 2,500 mcg by mouth daily.         . cycloSPORINE (RESTASIS) 0.05 % ophthalmic emulsion   Both Eyes   Place 1 drop into both eyes 2 (two) times daily.         . diclofenac sodium (VOLTAREN) 1 % GEL   Topical   Apply 4 g topically as needed.          Marland Kitchen. esomeprazole (NEXIUM) 40 MG capsule   Oral   Take 40 mg by mouth daily at 12 noon.         . Ferrous Sulfate (IRON) 325 (65 FE) MG TABS   Oral   Take 325 mg by mouth daily.          . Glucosamine 500 MG CAPS   Oral   Take by mouth daily.         . IPRATROPIUM BROMIDE HFA IN   Inhalation   Inhale 2 puffs into the lungs 4 (four) times daily. 2 puffs  4 times daily         . levocetirizine (XYZAL) 5 MG tablet   Oral   Take 5 mg by mouth every evening.         . mirtazapine (REMERON) 15 MG tablet   Oral   Take 15 mg by mouth at bedtime.         . montelukast (SINGULAIR) 10 MG tablet   Oral   Take 10 mg by mouth at bedtime.         . Multiple Vitamins-Minerals (MULTI FOR HER PO)   Oral   Take 1 tablet by mouth daily.          . Omega-3 Fatty Acids (FISH OIL) 1200 MG CAPS   Oral   Take 1 capsule by mouth daily.          . ondansetron (ZOFRAN) 4 MG tablet   Oral   Take 4 mg by mouth 3 (three) times daily.         . sennosides-docusate sodium (SENOKOT-S) 8.6-50 MG tablet   Oral   Take 3-6 tablets by mouth at bedtime. Patient states that she takes 3- 6 per night         . traMADol-acetaminophen (ULTRACET) 37.5-325  MG per tablet   Oral   Take 2 tablets by mouth every 6 (six) hours as needed for moderate pain or severe pain.          Marland Kitchen denosumab (PROLIA) 60 MG/ML SOLN injection   Subcutaneous   Inject 60 mg into the skin every 6 (six) months. Administer in upper arm, thigh, or abdomen          BP 136/76  Pulse 69  Temp(Src) 98.3 F (36.8 C)  (Oral)  Resp 18  Ht 5\' 5"  (1.651 m)  Wt 122 lb (55.339 kg)  BMI 20.30 kg/m2  SpO2 100% Physical Exam 1950: Physical examination:  Nursing notes reviewed; Vital signs and O2 SAT reviewed;  Constitutional: Well developed, Well nourished, Uncomfortable appearing.; Head:  Normocephalic, atraumatic; Eyes: EOMI, PERRL, No scleral icterus; ENMT: Mouth and pharynx normal, Mucous membranes dry; Neck: Supple, Full range of motion, No lymphadenopathy; Cardiovascular: Regular rate and rhythm, No gallop; Respiratory: Breath sounds clear & equal bilaterally, No wheezes.  Speaking full sentences with ease, Normal respiratory effort/excursion; Chest: Nontender, Movement normal; Abdomen: Soft, +RUQ, mid-epigastric, LUQ TTP; no rebound or guarding. Nondistended, Normal bowel sounds; Genitourinary: No CVA tenderness; Extremities: Pulses normal, No tenderness, No edema, No calf edema or asymmetry.; Neuro: AA&Ox3, Major CN grossly intact.  Speech clear. No gross focal motor or sensory deficits in extremities.; Skin: Color normal, Warm, Dry.   ED Course  Procedures     EKG Interpretation None      MDM  MDM Reviewed: previous chart, nursing note and vitals Reviewed previous: labs and ultrasound Interpretation: labs and x-ray    Results for orders placed during the hospital encounter of 09/11/13  CBC WITH DIFFERENTIAL      Result Value Ref Range   WBC 9.6  4.0 - 10.5 K/uL   RBC 3.81 (*) 3.87 - 5.11 MIL/uL   Hemoglobin 12.7  12.0 - 15.0 g/dL   HCT 40.9  81.1 - 91.4 %   MCV 99.2  78.0 - 100.0 fL   MCH 33.3  26.0 - 34.0 pg   MCHC 33.6  30.0 - 36.0 g/dL   RDW 78.2  95.6 - 21.3 %   Platelets 178  150 - 400 K/uL   Neutrophils Relative % 86 (*) 43 - 77 %   Neutro Abs 8.2 (*) 1.7 - 7.7 K/uL   Lymphocytes Relative 9 (*) 12 - 46 %   Lymphs Abs 0.9  0.7 - 4.0 K/uL   Monocytes Relative 5  3 - 12 %   Monocytes Absolute 0.5  0.1 - 1.0 K/uL   Eosinophils Relative 0  0 - 5 %   Eosinophils Absolute 0.0  0.0 -  0.7 K/uL   Basophils Relative 0  0 - 1 %   Basophils Absolute 0.0  0.0 - 0.1 K/uL  COMPREHENSIVE METABOLIC PANEL      Result Value Ref Range   Sodium 139  137 - 147 mEq/L   Potassium 4.1  3.7 - 5.3 mEq/L   Chloride 99  96 - 112 mEq/L   CO2 28  19 - 32 mEq/L   Glucose, Bld 122 (*) 70 - 99 mg/dL   BUN 22  6 - 23 mg/dL   Creatinine, Ser 0.86  0.50 - 1.10 mg/dL   Calcium 9.4  8.4 - 57.8 mg/dL   Total Protein 6.9  6.0 - 8.3 g/dL   Albumin 3.5  3.5 - 5.2 g/dL   AST 32  0 - 37 U/L  ALT 28  0 - 35 U/L   Alkaline Phosphatase 47  39 - 117 U/L   Total Bilirubin 0.5  0.3 - 1.2 mg/dL   GFR calc non Af Amer 77 (*) >90 mL/min   GFR calc Af Amer 89 (*) >90 mL/min  LIPASE, BLOOD      Result Value Ref Range   Lipase 624 (*) 11 - 59 U/L   Dg Ercp With Sphincterotomy 09/11/2013   CLINICAL DATA:  Dilated common bile duct stone with gallstones.  EXAM: ERCP  TECHNIQUE: Multiple spot images obtained with the fluoroscopic device and submitted for interpretation post-procedure.  COMPARISON:  None.  FINDINGS: Multiple spot fluoroscopic images are submitted. These were reviewed with Dr. Karilyn Cota following the procedure. The common bile duct is moderately dilated. The distal duct is not well visualized, although no definite filling defects or ulceration identified. Multiple gallstones are noted within the gallbladder lumen. Balloon pull-through was performed, not yielding any common duct stones. A plastic biliary stent was placed with adequate decompression of the biliary system. A small amount of contrast is noted within the pancreatic duct.  IMPRESSION: Cholelithiasis with biliary dilatation of undetermined etiology. No common duct stones identified radiographically or by endoscopy. Biliary stent placement.  These images were submitted for radiologic interpretation only. Please see the procedural report for the amount of contrast and the fluoroscopy time utilized.   Electronically Signed   By: Roxy Horseman M.D.   On:  09/11/2013 11:36   Dg Abd Acute W/chest 09/11/2013   CLINICAL DATA:  ERCP.  Nausea and abdominal pain.  EXAM: ACUTE ABDOMEN SERIES (ABDOMEN 2 VIEW & CHEST 1 VIEW)  COMPARISON:  DG ERCP WITH SPHINCTEROTOMY dated 09/11/2013  FINDINGS: COPD with hyperinflation. Normal cardiomediastinal silhouette. No infiltrates or failure. No effusion or pneumothorax.  Contrast opacified small bowel loops. There are large and small biliary stents in the expected region of the ampulla. The gallbladder remains partially filled with contrast. Bilateral hip replacements. Osseous demineralization. Moderate stool burden.  IMPRESSION: Post ERCP changes as described. Negative abdominal radiographs. No acute cardiopulmonary disease.   Electronically Signed   By: Davonna Belling M.D.   On: 09/11/2013 21:09     2035:  Lipase elevated. LFT's normal. T/C to GI Dr. Karilyn Cota, case discussed, including:  HPI, pertinent PM/SHx, VS/PE, dx testing, ED course and treatment:  Requests to keep pt NPO, start IV ancef 1gm q8h due to recent stent placement, recheck labs in the morning, admit to medicine service, consult GI MD in the morning. T/C to [facility] Dr. Karilyn Cota, case discussed, including:  HPI, pertinent PM/SHx, VS/PE, dx testing, ED course and treatment:  Agreeable to admit, requests he will come to ED for eval.       Laray Anger, DO 09/14/13 1534

## 2013-09-11 NOTE — Preoperative (Signed)
Beta Blockers   Reason not to administer Beta Blockers:Not Applicable 

## 2013-09-11 NOTE — H&P (Signed)
Linda Adkins is an 80 y.o. female.   Chief Complaint: Patient is  here for ERCP. HPI: Patient is an 80 year old Caucasian female who was recently evaluated for nausea anorexia and weight loss and found to have dilated biliary system and gallstones. She then had MRCP revealing dilated artery system which CHD much larger than CBD and a questionable focal wall thickening in midsection of bile duct. These studies were performed by her gastroenterologist Dr. Drue Stager she was sent over for further evaluation. Patient was evaluated in the office yesterday and is here for therapeutic ERCP. Details of rest of the history can be found in my note from yesterday.  Past Medical History  Diagnosis Date  . Hypertension   . Celiac disease   . Seasonal allergies   . GERD (gastroesophageal reflux disease)   . Hyperlipemia   . Depression   . Dexamethasone adverse reaction   . Glaucoma   . COPD (chronic obstructive pulmonary disease)   . Sleep apnea     o2 at night. No CPAP.  Marland Kitchen Arthritis   . Anemia     Past Surgical History  Procedure Laterality Date  . Colonoscopy  12/29/2010  . Upper gastrointestinal endoscopy  12/29/10  . Total hip arthroplasty Bilateral 04/13/10,2008  . Appendectomy    . Hernia repair      umbilical  . Cataract extraction, bilateral      Family History  Problem Relation Age of Onset  . Alzheimer's disease Mother   . Heart disease Father   . Lupus Brother   . Fibromyalgia Daughter   . Cystic fibrosis Daughter   . Healthy Daughter   . Fibromyalgia Son    Social History:  reports that she has never smoked. She has never used smokeless tobacco. She reports that she does not drink alcohol or use illicit drugs.  Allergies:  Allergies  Allergen Reactions  . Codeine   . Levaquin [Levofloxacin In D5w]   . Septra [Sulfamethoxazole-Tmp Ds]     Medications Prior to Admission  Medication Sig Dispense Refill  . Aclidinium Bromide (TUDORZA PRESSAIR) 400 MCG/ACT AEPB  Inhale 1 puff into the lungs 2 (two) times daily.      Marland Kitchen albuterol (PROVENTIL HFA;VENTOLIN HFA) 108 (90 BASE) MCG/ACT inhaler Inhale into the lungs. 2 puffs four times daily as needed      . ALPRAZolam (XANAX) 1 MG tablet Take 1 mg by mouth at bedtime as needed for anxiety. Patient take 1/2 tablet 3-4 times daily      . amLODipine (NORVASC) 5 MG tablet Take 5 mg by mouth. Patient takes 1/2 tablet in the morning , and 1/2 tablet in the pm.      . bimatoprost (LUMIGAN) 0.03 % ophthalmic solution Place 1 drop into both eyes at bedtime.      . Biotin 5000 MCG CAPS Take by mouth daily.      . Calcium Carb-Cholecalciferol (CALCIUM 600 + D) 600-200 MG-UNIT TABS Take by mouth daily.      . Cholecalciferol (VITAMIN D-3) 1000 UNITS CAPS Take by mouth daily.      . ciclesonide (OMNARIS) 50 MCG/ACT nasal spray Place 2 sprays into both nostrils daily.      Marland Kitchen CROMOLYN SODIUM IN Inhale into the lungs 2 (two) times daily. 20 mg/89m      . Cyanocobalamin (VITAMIN B 12 PO) Take 2,500 mcg by mouth daily.      . cycloSPORINE (RESTASIS) 0.05 % ophthalmic emulsion Place 1 drop into both eyes  2 (two) times daily.      Marland Kitchen denosumab (PROLIA) 60 MG/ML SOLN injection Inject 60 mg into the skin every 6 (six) months. Administer in upper arm, thigh, or abdomen      . diclofenac sodium (VOLTAREN) 1 % GEL Apply topically as needed.      Marland Kitchen esomeprazole (NEXIUM) 40 MG capsule Take 40 mg by mouth daily at 12 noon.      . Ferrous Sulfate (IRON) 325 (65 FE) MG TABS Take by mouth daily.      . Glucosamine 500 MG CAPS Take by mouth daily.      . IPRATROPIUM BROMIDE HFA IN Inhale 17 mcg into the lungs. 2 puffs  4 times daily      . levocetirizine (XYZAL) 5 MG tablet Take 5 mg by mouth every evening.      . mirtazapine (REMERON) 15 MG tablet Take 15 mg by mouth at bedtime.      . montelukast (SINGULAIR) 10 MG tablet Take 10 mg by mouth at bedtime.      . Multiple Vitamins-Minerals (MULTI FOR HER PO) Take by mouth daily.      . Omega-3  Fatty Acids (FISH OIL) 1200 MG CAPS Take by mouth daily.      . ondansetron (ZOFRAN) 4 MG tablet Take 4 mg by mouth 3 (three) times daily.      . sennosides-docusate sodium (SENOKOT-S) 8.6-50 MG tablet Take 1 tablet by mouth. Patient states that she takes 3- 6 per night      . traMADol-acetaminophen (ULTRACET) 37.5-325 MG per tablet Take 2 tablets by mouth every 6 (six) hours as needed.        Results for orders placed during the hospital encounter of 09/11/13 (from the past 48 hour(s))  CBC     Status: Abnormal   Collection Time    09/11/13  6:46 AM      Result Value Ref Range   WBC 4.2  4.0 - 10.5 K/uL   RBC 3.83 (*) 3.87 - 5.11 MIL/uL   Hemoglobin 12.7  12.0 - 15.0 g/dL   HCT 38.3  36.0 - 46.0 %   MCV 100.0  78.0 - 100.0 fL   MCH 33.2  26.0 - 34.0 pg   MCHC 33.2  30.0 - 36.0 g/dL   RDW 13.6  11.5 - 15.5 %   Platelets 161  150 - 400 K/uL  COMPREHENSIVE METABOLIC PANEL     Status: Abnormal   Collection Time    09/11/13  6:47 AM      Result Value Ref Range   Sodium 147  137 - 147 mEq/L   Potassium 4.4  3.7 - 5.3 mEq/L   Chloride 107  96 - 112 mEq/L   CO2 31  19 - 32 mEq/L   Glucose, Bld 88  70 - 99 mg/dL   BUN 24 (*) 6 - 23 mg/dL   Creatinine, Ser 0.90  0.50 - 1.10 mg/dL   Calcium 9.5  8.4 - 10.5 mg/dL   Total Protein 6.8  6.0 - 8.3 g/dL   Albumin 3.5  3.5 - 5.2 g/dL   AST 30  0 - 37 U/L   ALT 29  0 - 35 U/L   Alkaline Phosphatase 47  39 - 117 U/L   Total Bilirubin 0.3  0.3 - 1.2 mg/dL   GFR calc non Af Amer 58 (*) >90 mL/min   GFR calc Af Amer 67 (*) >90 mL/min   Comment: (NOTE)  The eGFR has been calculated using the CKD EPI equation.     This calculation has not been validated in all clinical situations.     eGFR's persistently <90 mL/min signify possible Chronic Kidney     Disease.  PROTIME-INR     Status: None   Collection Time    09/11/13  6:47 AM      Result Value Ref Range   Prothrombin Time 12.0  11.6 - 15.2 seconds   INR 0.90  0.00 - 1.49   No results  found.  ROS  Blood pressure 144/64, pulse 76, temperature 97.6 F (36.4 C), temperature source Oral, resp. rate 19, height 5' 5"  (1.651 m), weight 121 lb (54.885 kg), SpO2 95.00%. Physical Exam  Constitutional: She appears well-developed and well-nourished.  HENT:  Mouth/Throat: Oropharynx is clear and moist.  Eyes: Conjunctivae are normal. No scleral icterus.  Neck: No thyromegaly present.  Cardiovascular: Normal rate, regular rhythm and normal heart sounds.   No murmur heard. Respiratory: Effort normal and breath sounds normal.  GI: Soft. She exhibits no distension and no mass. There is no tenderness.  Musculoskeletal: She exhibits no edema.  Lymphadenopathy:    She has no cervical adenopathy.  Neurological: She is alert.  Skin: Skin is warm and dry.     Assessment/Plan Dilated biliary system. Choledocholithiasis suspected in spite of negative MRCP. Focal wall thickening to midsection of bile duct. Cholelithiasis. ERCP with sphincterotomy and possible Spyglass examination of bile duct  REHMAN,NAJEEB U 09/11/2013, 9:15 AM

## 2013-09-11 NOTE — Discharge Instructions (Signed)
Resume usual medications. No aspirin or NSAIDs for 3 days. Clear liquids today and then usual diet starting in a.m. Office visit in 2 weeks; office will call.   Endoscopic Retrograde Cholangiopancreatography (ERCP), Care After Refer to this sheet in the next few weeks. These instructions provide you with information on caring for yourself after your procedure. Your health care provider may also give you more specific instructions. Your treatment has been planned according to current medical practices, but problems sometimes occur. Call your health care provider if you have any problems or questions after your procedure.  WHAT TO EXPECT AFTER THE PROCEDURE  After your procedure, it is typical to feel:   Soreness in your throat.   Sick to your stomach (nauseous).   Bloated.  Dizzy.   Fatigued. HOME CARE INSTRUCTIONS  Have a friend or family member stay with you for the first 24 hours after your procedure.  Start taking your usual medicines and eating normally as soon as you feel well enough to do so or as directed by your health care provider. SEEK MEDICAL CARE IF:  You have abdominal pain.   You develop signs of infection, such as:   Chills.   Feeling unwell.  SEEK IMMEDIATE MEDICAL CARE IF:  You have difficulty swallowing.  You have worsening throat, chest, or abdominal pain.  You vomit.  You have bloody or very black stools.  You have a fever. Document Released: 04/15/2013 Document Reviewed: 12/29/2012 [facility] Patient Information 2014 [place], [place].

## 2013-09-11 NOTE — Op Note (Signed)
ERCP PROCEDURE REPORT  PATIENT:  Linda Adkins  MR#:  [id number] Birthdate:  05/20/1929, 80 y.o., female Endoscopist:  Dr. Malissa HippoNajeeb U. Tayten Heber, MD Referred By:  Dr. Halina MaidensJack Spainhour, MD Procedure Date: 09/11/2013  Procedure:   ERCP with biliary sphincterotomy and biliary and pancreatic stenting.    Indications:  Patient is an 80 year old Caucasian female who presents with nausea anorexia and weight loss and found to have markedly dilated biliary system and a questionable focal abnormality in midsection of bile duct on MRCP. She is undergoing diagnostic and therapeutic ERCP longus Spyglass examination.            Informed Consent:  The risks, benefits, limitations, alternatives, and mponderable have been reviewed with the patient. I specifically discussed a 1 in 10 chance of pancreatitis, reaction to medications, bleeding, perforation and the possibility of a failed ERCP. Potential for sphincterotomy and stent placement also reviewed. Questions have been answered. All parties agreeable.  Please see history & physical in medical record for more information.  Medications:  Gen. endotracheal anesthesia. *Please see anesthesia record for complete details  Description of procedure:  Procedure performed in the OR. The patient was placed under anesthesia, intubated, and turned into semipermanent position. Therapeutic Pentax video duodenoscope passed through the oropharynx without any difficulty into the esophagus, stomach, and across the pylorus and pull, and descending duodenum.    Findings:  Normal appearing ampulla water but short intramural segment. Pancreaticogram normal. Selective cannulation of bile duct was difficult and facilitated by leaving guidewire in pancreatic duct. Dilated CBD and CHD. CHD was larger in diameter than CBD measuring 17-18 mm. No filling defects noted. Left intrahepatic biliary system mildly dilated and) was not filled. Biliary sphincterotomy performed and balloon stone  extractor passed through bile duct and across sphincterotomy. Single pigtail 4 French 5 cm long pancreatic stent placed for prophylaxis. Spyglass examination attempted but not completed because of oozing from sphincterotomy. 10 French 9 cm biliary stent placed for decompression. Multiple filling defects in the gallbladder consistent with stones.   Therapeutic/Diagnostic Maneuvers Performed:  See above.  Complications:  None  Impression:  Markedly dilated CHD along with dilation of CBD without stricture or stones. Biliary sphincterotomy performed and biliary stent placed for decompression. Pancreatic stent placed for prophylaxis. Multiple gallstones. Spyglass examination not performed.  Recommendations:  Clear liquids today. No aspirin or NSAIDs for 3 days. Office visit in 2 weeks. Surgical consultation for cholecystectomy following office visit. Repeat ERCP in 6-8 weeks.  Shayana Hornstein U  09/11/2013  11:27 AM  CC: Dr. Aggie CosierBALAJI DESAI, MD & Dr. Bonnetta BarryNo ref. provider found  CC: Dr. Halina MaidensJack Spainhour, MD

## 2013-09-11 NOTE — Transfer of Care (Signed)
Immediate Anesthesia Transfer of Care Note  Patient: Wynetta FinesMarie T Eastburn  Procedure(s) Performed: Procedure(s): ENDOSCOPIC RETROGRADE CHOLANGIOPANCREATOGRAPHY (ERCP) (N/A) SPHINCTEROTOMY (N/A) SPYGLASS CHOLANGIOSCOPY (N/A) REMOVAL OF STONES (N/A) PANCREATIC STENT PLACEMENT (N/A) BILIARY STENT PLACEMENT (N/A)  Patient Location: PACU  Anesthesia Type:General  Level of Consciousness: awake, alert  and oriented  Airway & Oxygen Therapy: Patient Spontanous Breathing and Patient connected to face mask oxygen  Post-op Assessment: Report given to PACU RN and Post -op Vital signs reviewed and stable  Post vital signs: Reviewed and stable  Complications: No apparent anesthesia complications

## 2013-09-11 NOTE — ED Notes (Signed)
abd pain since had  ERCP done this AM.  Nausea, no vomiting.  Has gallstones

## 2013-09-11 NOTE — Anesthesia Preprocedure Evaluation (Signed)
Anesthesia Evaluation  Patient identified by MRN, date of birth, ID band Patient awake    Reviewed: Allergy & Precautions, H&P , NPO status , Patient's Chart, lab work & pertinent test results  Airway Mallampati: II TM Distance: >3 FB Neck ROM: Full    Dental  (+) Teeth Intact   Pulmonary sleep apnea and Oxygen sleep apnea , COPD COPD inhaler,  breath sounds clear to auscultation        Cardiovascular hypertension, Pt. on medications Rhythm:Regular Rate:Normal     Neuro/Psych PSYCHIATRIC DISORDERS Depression    GI/Hepatic GERD-  Medicated and Controlled,  Endo/Other    Renal/GU      Musculoskeletal   Abdominal   Peds  Hematology  (+) anemia ,   Anesthesia Other Findings   Reproductive/Obstetrics                           Anesthesia Physical Anesthesia Plan  ASA: III  Anesthesia Plan: General   Post-op Pain Management:    Induction: Intravenous, Rapid sequence and Cricoid pressure planned  Airway Management Planned: Oral ETT  Additional Equipment:   Intra-op Plan:   Post-operative Plan: Extubation in OR  Informed Consent: I have reviewed the patients History and Physical, chart, labs and discussed the procedure including the risks, benefits and alternatives for the proposed anesthesia with the patient or authorized representative who has indicated his/her understanding and acceptance.     Plan Discussed with:   Anesthesia Plan Comments: (amidate induction)        Anesthesia Quick Evaluation  

## 2013-09-11 NOTE — Anesthesia Procedure Notes (Signed)
Procedure Name: Intubation Date/Time: 09/11/2013 9:39 AM Performed by: Pernell DupreADAMS, Argie Lober A Pre-anesthesia Checklist: Patient identified, Patient being monitored, Timeout performed, Emergency Drugs available and Suction available Patient Re-evaluated:Patient Re-evaluated prior to inductionOxygen Delivery Method: Circle System Utilized Preoxygenation: Pre-oxygenation with 100% oxygen Intubation Type: IV induction, Rapid sequence and Cricoid Pressure applied Ventilation: Mask ventilation without difficulty Grade View: Grade I Tube type: Oral Tube size: 7.0 mm Number of attempts: 1 Airway Equipment and Method: stylet and Video-laryngoscopy Placement Confirmation: ETT inserted through vocal cords under direct vision,  positive ETCO2 and breath sounds checked- equal and bilateral Secured at: 21 cm Tube secured with: Tape Dental Injury: Teeth and Oropharynx as per pre-operative assessment

## 2013-09-11 NOTE — ED Notes (Signed)
Family at bedside. Patient states that she having pain in abdominal area. States pain level is at 3 right now.

## 2013-09-11 NOTE — H&P (Signed)
[facility] History and Physical  Linda Adkins ZOX:[id number] DOB: 10/21/1928 DOA: 09/11/2013  Referring physician: ER PCP: Aggie CosierBALAJI DESAI, MD   Chief Complaint: Epigastric pain  HPI: Linda FinesMarie T Wickware is a 80 y.o. female  Who had ERCP earlier today.Post procedure,she developed epigastric pain radiating between her shoulder blades,associated with nausea.She underwent ERCP with biliary sphincterotomy and biliary and pancreatic stenting. She was found to have markedly dilated CHD along with dilation of CBD without stricture or stones.     Review of Systems:  Constitutional:  No weight loss, night sweats, Fevers, chills, fatigue.  HEENT:  No headaches, Difficulty swallowing,Tooth/dental problems,Sore throat,  No sneezing, itching, ear ache, nasal congestion, post nasal drip,  Cardio-vascular:  No chest pain, Orthopnea, PND, swelling in lower extremities, anasarca, dizziness, palpitations   Resp:  No shortness of breath with exertion or at rest. No excess mucus, no productive cough, No non-productive cough, No coughing up of blood.No change in color of mucus.No wheezing.No chest wall deformity  Skin:  no rash or lesions.  GU:  no dysuria, change in color of urine, no urgency or frequency. No flank pain.  Musculoskeletal:  No joint pain or swelling. No decreased range of motion. No back pain.  Psych:  No change in mood or affect. No depression or anxiety. No memory loss.   Past Medical History  Diagnosis Date  . Hypertension   . Celiac disease   . Seasonal allergies   . GERD (gastroesophageal reflux disease)   . Hyperlipemia   . Depression   . Dexamethasone adverse reaction   . Glaucoma   . COPD (chronic obstructive pulmonary disease)   . Sleep apnea     o2 at night. No CPAP.  Marland Kitchen. Arthritis   . Anemia    Past Surgical History  Procedure Laterality Date  . Colonoscopy  12/29/2010  . Upper gastrointestinal endoscopy  12/29/10  . Total hip arthroplasty Bilateral  04/13/10,2008  . Appendectomy    . Hernia repair      umbilical  . Cataract extraction, bilateral    . Joint replacement     Social History:  reports that she has never smoked. She has never used smokeless tobacco. She reports that she does not drink alcohol or use illicit drugs.  Allergies  Allergen Reactions  . Codeine Nausea Only  . Levaquin [Levofloxacin In D5w] Nausea And Vomiting and Other (See Comments)    CAUSED BLINDNESS, SKIN SENSITIVE TO TOUCH  . Septra [Sulfamethoxazole-Tmp Ds] Other (See Comments)    unknown    Family History  Problem Relation Age of Onset  . Alzheimer's disease Mother   . Heart disease Father   . Lupus Brother   . Fibromyalgia Daughter   . Cystic fibrosis Daughter   . Healthy Daughter   . Fibromyalgia Son      Prior to Admission medications   Medication Sig Start Date End Date Taking? Authorizing Provider  Aclidinium Bromide (TUDORZA PRESSAIR) 400 MCG/ACT AEPB Inhale 1 puff into the lungs 2 (two) times daily.   Yes Historical Provider, MD  ALPRAZolam Prudy Feeler(XANAX) 1 MG tablet Take 0.5 mg by mouth 3 (three) times daily as needed for anxiety. Patient take 1/2 tablet 3-4 times daily   Yes Historical Provider, MD  amLODipine (NORVASC) 5 MG tablet Take 2.5 mg by mouth 2 (two) times daily. Patient takes 1/2 tablet in the morning , and 1/2 tablet in the pm.   Yes Historical Provider, MD  bimatoprost (LUMIGAN) 0.03 % ophthalmic solution Place  1 drop into both eyes at bedtime.   Yes Historical Provider, MD  Biotin 5000 MCG CAPS Take by mouth daily.   Yes Historical Provider, MD  Calcium Carb-Cholecalciferol (CALCIUM 600 + D) 600-200 MG-UNIT TABS Take 1 tablet by mouth daily.    Yes Historical Provider, MD  Cholecalciferol (VITAMIN D-3) 1000 UNITS CAPS Take by mouth daily.   Yes Historical Provider, MD  ciclesonide (OMNARIS) 50 MCG/ACT nasal spray Place 2 sprays into both nostrils daily.   Yes Historical Provider, MD  CROMOLYN SODIUM IN Inhale into the lungs 2  (two) times daily. 20 mg/29mL   Yes Historical Provider, MD  Cyanocobalamin (VITAMIN B 12 PO) Take 2,500 mcg by mouth daily.   Yes Historical Provider, MD  cycloSPORINE (RESTASIS) 0.05 % ophthalmic emulsion Place 1 drop into both eyes 2 (two) times daily.   Yes Historical Provider, MD  diclofenac sodium (VOLTAREN) 1 % GEL Apply 4 g topically as needed.    Yes Historical Provider, MD  esomeprazole (NEXIUM) 40 MG capsule Take 40 mg by mouth daily at 12 noon.   Yes Historical Provider, MD  Ferrous Sulfate (IRON) 325 (65 FE) MG TABS Take 325 mg by mouth daily.    Yes Historical Provider, MD  Glucosamine 500 MG CAPS Take by mouth daily.   Yes Historical Provider, MD  IPRATROPIUM BROMIDE HFA IN Inhale 2 puffs into the lungs 4 (four) times daily. 2 puffs  4 times daily   Yes Historical Provider, MD  levocetirizine (XYZAL) 5 MG tablet Take 5 mg by mouth every evening.   Yes Historical Provider, MD  mirtazapine (REMERON) 15 MG tablet Take 15 mg by mouth at bedtime.   Yes Historical Provider, MD  montelukast (SINGULAIR) 10 MG tablet Take 10 mg by mouth at bedtime.   Yes Historical Provider, MD  Multiple Vitamins-Minerals (MULTI FOR HER PO) Take 1 tablet by mouth daily.    Yes Historical Provider, MD  Omega-3 Fatty Acids (FISH OIL) 1200 MG CAPS Take 1 capsule by mouth daily.    Yes Historical Provider, MD  ondansetron (ZOFRAN) 4 MG tablet Take 4 mg by mouth 3 (three) times daily.   Yes Historical Provider, MD  sennosides-docusate sodium (SENOKOT-S) 8.6-50 MG tablet Take 3-6 tablets by mouth at bedtime. Patient states that she takes 3- 6 per night   Yes Historical Provider, MD  traMADol-acetaminophen (ULTRACET) 37.5-325 MG per tablet Take 2 tablets by mouth every 6 (six) hours as needed for moderate pain or severe pain.    Yes Historical Provider, MD  denosumab (PROLIA) 60 MG/ML SOLN injection Inject 60 mg into the skin every 6 (six) months. Administer in upper arm, thigh, or abdomen    Historical Provider, MD    Physical Exam: Filed Vitals:   09/11/13 1745  BP: 136/76  Pulse: 69  Temp: 98.3 F (36.8 C)  Resp: 18    BP 136/76  Pulse 69  Temp(Src) 98.3 F (36.8 C) (Oral)  Resp 18  Ht 5\' 5"  (1.651 m)  Wt 55.339 kg (122 lb)  BMI 20.30 kg/m2  SpO2 100%  General:  Appears uncomfortable.Anxious. Eyes: PERRL, normal lids, irises & conjunctiva ENT: grossly normal hearing, lips & tongue Neck: no LAD, masses or thyromegaly Cardiovascular: RRR, no m/r/g. No LE edema. Telemetry: SR, no arrhythmias  Respiratory: CTA bilaterally, no w/r/r. Normal respiratory effort. Abdomen: soft, slightly tender in epigastric area Skin: no rash or induration seen on limited exam Musculoskeletal: grossly normal tone BUE/BLE Psychiatric: grossly normal mood and affect,  speech fluent and appropriate Neurologic: grossly non-focal.          Labs on Admission:  Basic Metabolic Panel:  Recent Labs Lab 09/11/13 0647 09/11/13 1930  NA 147 139  K 4.4 4.1  CL 107 99  CO2 31 28  GLUCOSE 88 122*  BUN 24* 22  CREATININE 0.90 0.74  CALCIUM 9.5 9.4   Liver Function Tests:  Recent Labs Lab 09/11/13 0647 09/11/13 1930  AST 30 32  ALT 29 28  ALKPHOS 47 47  BILITOT 0.3 0.5  PROT 6.8 6.9  ALBUMIN 3.5 3.5    Recent Labs Lab 09/11/13 1930  LIPASE 624*    CBC:  Recent Labs Lab 09/11/13 0646 09/11/13 1930  WBC 4.2 9.6  NEUTROABS  --  8.2*  HGB 12.7 12.7  HCT 38.3 37.8  MCV 100.0 99.2  PLT 161 178    Radiological Exams on Admission: Dg Ercp With Sphincterotomy  09/11/2013   CLINICAL DATA:  Dilated common bile duct stone with gallstones.  EXAM: ERCP  TECHNIQUE: Multiple spot images obtained with the fluoroscopic device and submitted for interpretation post-procedure.  COMPARISON:  None.  FINDINGS: Multiple spot fluoroscopic images are submitted. These were reviewed with Dr. Karilyn Cota following the procedure. The common bile duct is moderately dilated. The distal duct is not well visualized,  although no definite filling defects or ulceration identified. Multiple gallstones are noted within the gallbladder lumen. Balloon pull-through was performed, not yielding any common duct stones. A plastic biliary stent was placed with adequate decompression of the biliary system. A small amount of contrast is noted within the pancreatic duct.  IMPRESSION: Cholelithiasis with biliary dilatation of undetermined etiology. No common duct stones identified radiographically or by endoscopy. Biliary stent placement.  These images were submitted for radiologic interpretation only. Please see the procedural report for the amount of contrast and the fluoroscopy time utilized.   Electronically Signed   By: Roxy Horseman M.D.   On: 09/11/2013 11:36      Assessment/Plan   1. Acute pancreatitis s/p ERCP 2. Hypertension 3. COPD,stable.  Plan: 1.Admit to medical floor 2.NPO 3.iv fluids 4.iv antibiotics 5.Analgesia as required 6.Antiemetics as required. 7.Gastroenterology consultation    Code Status: FULL   Family Communication: Discussed plan with patient   Disposition Plan: Home when medically stable   Time spent: 45 mins  GOSRANI,NIMISH C [facility]

## 2013-09-12 ENCOUNTER — Encounter (HOSPITAL_COMMUNITY): Payer: Self-pay | Admitting: Internal Medicine

## 2013-09-12 DIAGNOSIS — E785 Hyperlipidemia, unspecified: Secondary | ICD-10-CM

## 2013-09-12 DIAGNOSIS — K8020 Calculus of gallbladder without cholecystitis without obstruction: Secondary | ICD-10-CM

## 2013-09-12 DIAGNOSIS — K838 Other specified diseases of biliary tract: Secondary | ICD-10-CM

## 2013-09-12 DIAGNOSIS — Z9889 Other specified postprocedural states: Secondary | ICD-10-CM

## 2013-09-12 DIAGNOSIS — F419 Anxiety disorder, unspecified: Secondary | ICD-10-CM

## 2013-09-12 DIAGNOSIS — K859 Acute pancreatitis without necrosis or infection, unspecified: Secondary | ICD-10-CM

## 2013-09-12 DIAGNOSIS — F411 Generalized anxiety disorder: Secondary | ICD-10-CM

## 2013-09-12 HISTORY — DX: Anxiety disorder, unspecified: F41.9

## 2013-09-12 HISTORY — DX: Calculus of gallbladder without cholecystitis without obstruction: K80.20

## 2013-09-12 HISTORY — DX: Other specified diseases of biliary tract: K83.8

## 2013-09-12 LAB — CBC
HCT: 35.1 % — ABNORMAL LOW (ref 36.0–46.0)
Hemoglobin: 11.8 g/dL — ABNORMAL LOW (ref 12.0–15.0)
MCH: 33.4 pg (ref 26.0–34.0)
MCHC: 33.6 g/dL (ref 30.0–36.0)
MCV: 99.4 fL (ref 78.0–100.0)
Platelets: 153 10*3/uL (ref 150–400)
RBC: 3.53 MIL/uL — ABNORMAL LOW (ref 3.87–5.11)
RDW: 13.7 % (ref 11.5–15.5)
WBC: 7.1 10*3/uL (ref 4.0–10.5)

## 2013-09-12 LAB — COMPREHENSIVE METABOLIC PANEL
ALK PHOS: 39 U/L (ref 39–117)
ALT: 20 U/L (ref 0–35)
AST: 26 U/L (ref 0–37)
Albumin: 3 g/dL — ABNORMAL LOW (ref 3.5–5.2)
BILIRUBIN TOTAL: 0.6 mg/dL (ref 0.3–1.2)
BUN: 15 mg/dL (ref 6–23)
CHLORIDE: 105 meq/L (ref 96–112)
CO2: 24 mEq/L (ref 19–32)
Calcium: 8.4 mg/dL (ref 8.4–10.5)
Creatinine, Ser: 0.71 mg/dL (ref 0.50–1.10)
GFR calc non Af Amer: 78 mL/min — ABNORMAL LOW (ref >90)
GLUCOSE: 85 mg/dL (ref 70–99)
POTASSIUM: 4 meq/L (ref 3.7–5.3)
Sodium: 141 mEq/L (ref 137–147)
Total Protein: 5.6 g/dL — ABNORMAL LOW (ref 6.0–8.3)

## 2013-09-12 LAB — LIPASE, BLOOD: Lipase: 217 U/L — ABNORMAL HIGH (ref 11–59)

## 2013-09-12 MED ORDER — SALINE SPRAY 0.65 % NA SOLN
1.0000 | NASAL | Status: DC | PRN
  Administered 2013-09-12: 1 via NASAL
  Filled 2013-09-12: qty 44

## 2013-09-12 MED ORDER — LORAZEPAM 0.5 MG PO TABS: 0.5000 mg | ORAL_TABLET | Freq: Once | ORAL | Status: DC

## 2013-09-12 MED ORDER — PANTOPRAZOLE SODIUM 40 MG IV SOLR
40.0000 mg | Freq: Two times a day (BID) | INTRAVENOUS | Status: DC
  Administered 2013-09-12 – 2013-09-13 (×2): 40 mg via INTRAVENOUS
  Filled 2013-09-12 (×2): qty 40

## 2013-09-12 MED ORDER — LORAZEPAM 2 MG/ML IJ SOLN
0.5000 mg | Freq: Once | INTRAMUSCULAR | Status: AC
  Administered 2013-09-12: 0.5 mg via INTRAVENOUS
  Filled 2013-09-12: qty 1

## 2013-09-12 MED ORDER — ONDANSETRON HCL 4 MG/2ML IJ SOLN
4.0000 mg | Freq: Three times a day (TID) | INTRAMUSCULAR | Status: DC
  Administered 2013-09-12 – 2013-09-14 (×9): 4 mg via INTRAVENOUS
  Filled 2013-09-12 (×8): qty 2

## 2013-09-12 MED ORDER — PANTOPRAZOLE SODIUM 40 MG IV SOLR: 40.0000 mg | Freq: Two times a day (BID) | INTRAVENOUS | Status: DC

## 2013-09-12 MED ORDER — ALPRAZOLAM 0.5 MG PO TABS
0.5000 mg | ORAL_TABLET | Freq: Four times a day (QID) | ORAL | Status: DC
  Administered 2013-09-12 – 2013-09-14 (×9): 0.5 mg via ORAL
  Filled 2013-09-12 (×9): qty 1

## 2013-09-12 MED ORDER — ONDANSETRON HCL 4 MG/2ML IJ SOLN
4.0000 mg | Freq: Four times a day (QID) | INTRAMUSCULAR | Status: DC | PRN
  Administered 2013-09-12: 4 mg via INTRAVENOUS
  Filled 2013-09-12: qty 2

## 2013-09-12 MED ORDER — IPRATROPIUM BROMIDE 0.02 % IN SOLN
0.5000 mg | Freq: Four times a day (QID) | RESPIRATORY_TRACT | Status: DC
  Administered 2013-09-12 – 2013-09-13 (×4): 0.5 mg via RESPIRATORY_TRACT
  Filled 2013-09-12 (×3): qty 2.5

## 2013-09-12 MED ORDER — HALOPERIDOL LACTATE 5 MG/ML IJ SOLN: 5.0000 mg | Freq: Once | INTRAMUSCULAR | Status: DC

## 2013-09-12 NOTE — Progress Notes (Signed)
Utilization review Completed Daijah Scrivens RN BSN   

## 2013-09-12 NOTE — Progress Notes (Signed)
[facility] PROGRESS NOTE  Linda Adkins ZOX:[id number] DOB: 04/01/1929 DOA: 09/11/2013 PCP: Aggie CosierBALAJI DESAI, MD    Code Status: Full code Family Communication: Discussed with her daughter Disposition Plan: Anticipate discharge to home when medically appropriate   Consultants:  GI pending  Procedures:  None  Antibiotics:  Cefazolin 09/11/13>>>  HPI/Subjective: The patient has no complaints of abdominal pain currently. She had nausea earlier, but no vomiting. She was given medication for it. She has chronic sinus allergies and is requesting saline nasal spray. She also has some anxiety and is requesting restart of Xanax.  Objective: Filed Vitals:   09/12/13 0614  BP: 109/49  Pulse: 64  Temp: 98.5 F (36.9 C)  Resp: 20   oxygen saturation 96% on room air.  Intake/Output Summary (Last 24 hours) at 09/12/13 1129 Last data filed at 09/12/13 0500  Gross per 24 hour  Intake 563.33 ml  Output    600 ml  Net -36.67 ml   Filed Weights   09/11/13 1745 09/11/13 2307 09/12/13 0614  Weight: 55.339 kg (122 lb) 56.8 kg (125 lb 3.5 oz) 49.2 kg (108 lb 7.5 oz)    Exam:   General:  Pleasant elderly 80 year old Caucasian woman, lying in bed, in no acute distress.  Cardiovascular: S1, S2, with no murmurs rubs or gallops.  Respiratory: Clear to auscultation bilaterally.  Abdomen: Hypoactive bowel sounds, soft, minimal tenderness in the epigastrium. No rigidity or masses palpated.  Musculoskeletal: No acute hot red joints.  Neurologic/psychiatric: She is alert and oriented x3. Cranial nerves II through XII are intact. Pleasant affect.   Data Reviewed: Basic Metabolic Panel:  Recent Labs Lab 09/11/13 0647 09/11/13 1930 09/12/13 0609  NA 147 139 141  K 4.4 4.1 4.0  CL 107 99 105  CO2 31 28 24   GLUCOSE 88 122* 85  BUN 24* 22 15  CREATININE 0.90 0.74 0.71  CALCIUM 9.5 9.4 8.4   Liver Function Tests:  Recent Labs Lab 09/11/13 0647 09/11/13 1930 09/12/13 0609   AST 30 32 26  ALT 29 28 20   ALKPHOS 47 47 39  BILITOT 0.3 0.5 0.6  PROT 6.8 6.9 5.6*  ALBUMIN 3.5 3.5 3.0*    Recent Labs Lab 09/11/13 1930 09/12/13 0609  LIPASE 624* 217*   No results found for this basename: AMMONIA,  in the last 168 hours CBC:  Recent Labs Lab 09/11/13 0646 09/11/13 1930 09/12/13 0609  WBC 4.2 9.6 7.1  NEUTROABS  --  8.2*  --   HGB 12.7 12.7 11.8*  HCT 38.3 37.8 35.1*  MCV 100.0 99.2 99.4  PLT 161 178 153   Cardiac Enzymes: No results found for this basename: CKTOTAL, CKMB, CKMBINDEX, TROPONINI,  in the last 168 hours BNP (last 3 results) No results found for this basename: PROBNP,  in the last 8760 hours CBG: No results found for this basename: GLUCAP,  in the last 168 hours  No results found for this or any previous visit (from the past 240 hour(s)).   Studies: Dg Ercp With Sphincterotomy  09/11/2013   CLINICAL DATA:  Dilated common bile duct stone with gallstones.  EXAM: ERCP  TECHNIQUE: Multiple spot images obtained with the fluoroscopic device and submitted for interpretation post-procedure.  COMPARISON:  None.  FINDINGS: Multiple spot fluoroscopic images are submitted. These were reviewed with Dr. Karilyn Cotaehman following the procedure. The common bile duct is moderately dilated. The distal duct is not well visualized, although no definite filling defects or ulceration identified. Multiple gallstones are  noted within the gallbladder lumen. Balloon pull-through was performed, not yielding any common duct stones. A plastic biliary stent was placed with adequate decompression of the biliary system. A small amount of contrast is noted within the pancreatic duct.  IMPRESSION: Cholelithiasis with biliary dilatation of undetermined etiology. No common duct stones identified radiographically or by endoscopy. Biliary stent placement.  These images were submitted for radiologic interpretation only. Please see the procedural report for the amount of contrast and the  fluoroscopy time utilized.   Electronically Signed   By: Roxy Horseman M.D.   On: 09/11/2013 11:36   Dg Abd Acute W/chest  09/11/2013   CLINICAL DATA:  ERCP.  Nausea and abdominal pain.  EXAM: ACUTE ABDOMEN SERIES (ABDOMEN 2 VIEW & CHEST 1 VIEW)  COMPARISON:  DG ERCP WITH SPHINCTEROTOMY dated 09/11/2013  FINDINGS: COPD with hyperinflation. Normal cardiomediastinal silhouette. No infiltrates or failure. No effusion or pneumothorax.  Contrast opacified small bowel loops. There are large and small biliary stents in the expected region of the ampulla. The gallbladder remains partially filled with contrast. Bilateral hip replacements. Osseous demineralization. Moderate stool burden.  IMPRESSION: Post ERCP changes as described. Negative abdominal radiographs. No acute cardiopulmonary disease.   Electronically Signed   By: Davonna Belling M.D.   On: 09/11/2013 21:09    Scheduled Meds: .  ceFAZolin (ANCEF) IV  1 g Intravenous 3 times per day  . cromolyn  20 mg Nebulization TID  . cycloSPORINE  1 drop Both Eyes BID  . heparin  5,000 Units Subcutaneous 3 times per day  . ipratropium  0.5 mg Nebulization QID   Continuous Infusions: . sodium chloride 100 mL/hr at 09/12/13 0234   Assessment:  Principal Problem:   Pancreatitis, acute Active Problems:   Cholelithiasis   S/P ERCP   Common bile duct dilation   GERD (gastroesophageal reflux disease)   Depression   Hypertension   Hyperlipidemia   Chronic anxiety    1. Acute pancreatitis, status post ERCP, sphincterotomy, and stenting on 09/11/13. Her lipase was 624 on admission. It has improved to 217 today. Her liver transaminases are within normal limits. She has less abdominal pain, but with analgesics. We'll continue IV fluids, bowel rest, analgesics, and antiemetics. Cefazolin started empirically, we'll continue for now. Will allow ice chips. Gastroenterology consulted. Continue to monitor her lipase and symptoms. We'll order a fasting lipid profile in the  morning.  History of dilated common hepatic duct and common bile duct with evidence of cholelithiasis. The patient underwent ERCP with biliary sphincterotomy and biliary and pancreatic stenting by Dr. Karilyn Cota on 09/11/2013. She will likely need a cholecystectomy, but probably not emergently.  GERD. Will restart PPI, IV.  Hyperlipidemia. Omega-3 fatty acids are on hold. We'll order a fasting lipid profile for tomorrow morning.  Hypertension. She is treated chronically with amlodipine which is currently on hold. Her blood pressure is on the low-normal side. Continue monitoring.  Seasonal allergies. We'll add saline nasal spray as needed. Currently holding antihistamines.  Chronic depression and anxiety. Will restart Xanax orally to avoid withdrawal syndrome. Will hold other oral medications.    Plan: 1. Continue IV fluid hydration and supportive treatment. 2. Will restart PPI IV every 12 hours. 3. Continue monitoring her lipase daily. 4. GI consult pending. Continue cefazolin for now, but will ask GI for further recommendations.   Time spent: 35 minutes.    [facility]  [facility] Pager [phone]. If 7PM-7AM, please contact night-coverage at [email or web address], password [facility] 09/12/2013, 11:29 AM  LOS: 1 day

## 2013-09-12 NOTE — Consult Note (Signed)
Referring Provider: No ref. provider found Primary Care Physician:  Aggie Cosier, MD Primary Gastroenterologist:  DR. Karilyn Cota  Reason for Consultation:  PANCREATITIS  HPI:  PT S/P ERCP PROLONGED EFFORT TO INTUBATE CBD. WIRE PLACED IN PANCREATIC DUCT. STENT PLACED ON PANCREAS TO PREVENT POST-ERCP PANCREATITIS. PT PRESENTED TO ED WITH ABD PAIN AND NAUSEA. LIPASE > 600 NOW DOWN TO 217. CURRENTLY NPO. ABD PAIN IMPROVED. NAUSEA BETTER. NO VOMITING. TOLERATING ICE CHIPS.   Past Medical History  Diagnosis Date  . Hypertension   . Celiac disease   . Seasonal allergies   . GERD (gastroesophageal reflux disease)   . Hyperlipemia   . Depression   . Dexamethasone adverse reaction   . Glaucoma   . COPD (chronic obstructive pulmonary disease)   . Sleep apnea     o2 at night. No CPAP.  Marland Kitchen Arthritis   . Anemia   . Cholelithiasis 09/12/2013  . Common bile duct dilation 09/12/2013  . Chronic anxiety 09/12/2013    Past Surgical History  Procedure Laterality Date  . Colonoscopy  12/29/2010  . Upper gastrointestinal endoscopy  12/29/10  . Total hip arthroplasty Bilateral 04/13/10,2008  . Appendectomy    . Hernia repair      umbilical  . Cataract extraction, bilateral    . Joint replacement    . Ercp w/ sphicterotomy  09/11/13    And stent placement    Prior to Admission medications   Medication Sig Start Date End Date Taking? Authorizing Provider  Aclidinium Bromide (TUDORZA PRESSAIR) 400 MCG/ACT AEPB Inhale 1 puff into the lungs 2 (two) times daily.   Yes Historical Provider, MD  ALPRAZolam Prudy Feeler) 1 MG tablet Take 0.5 mg by mouth 3 (three) times daily as needed for anxiety. Patient take 1/2 tablet 3-4 times daily   Yes Historical Provider, MD  amLODipine (NORVASC) 5 MG tablet Take 2.5 mg by mouth 2 (two) times daily. Patient takes 1/2 tablet in the morning , and 1/2 tablet in the pm.   Yes Historical Provider, MD  bimatoprost (LUMIGAN) 0.03 % ophthalmic solution Place 1 drop into both eyes at  bedtime.   Yes Historical Provider, MD  Biotin 5000 MCG CAPS Take by mouth daily.   Yes Historical Provider, MD  Calcium Carb-Cholecalciferol (CALCIUM 600 + D) 600-200 MG-UNIT TABS Take 1 tablet by mouth daily.    Yes Historical Provider, MD  Cholecalciferol (VITAMIN D-3) 1000 UNITS CAPS Take by mouth daily.   Yes Historical Provider, MD  ciclesonide (OMNARIS) 50 MCG/ACT nasal spray Place 2 sprays into both nostrils daily.   Yes Historical Provider, MD  CROMOLYN SODIUM IN Inhale into the lungs 2 (two) times daily. 20 mg/19mL   Yes Historical Provider, MD  Cyanocobalamin (VITAMIN B 12 PO) Take 2,500 mcg by mouth daily.   Yes Historical Provider, MD  cycloSPORINE (RESTASIS) 0.05 % ophthalmic emulsion Place 1 drop into both eyes 2 (two) times daily.   Yes Historical Provider, MD  diclofenac sodium (VOLTAREN) 1 % GEL Apply 4 g topically as needed.    Yes Historical Provider, MD  esomeprazole (NEXIUM) 40 MG capsule Take 40 mg by mouth daily at 12 noon.   Yes Historical Provider, MD  Ferrous Sulfate (IRON) 325 (65 FE) MG TABS Take 325 mg by mouth daily.    Yes Historical Provider, MD  Glucosamine 500 MG CAPS Take by mouth daily.   Yes Historical Provider, MD  IPRATROPIUM BROMIDE HFA IN Inhale 2 puffs into the lungs 4 (four) times daily. 2 puffs  4 times daily   Yes Historical Provider, MD  levocetirizine (XYZAL) 5 MG tablet Take 5 mg by mouth every evening.   Yes Historical Provider, MD  mirtazapine (REMERON) 15 MG tablet Take 15 mg by mouth at bedtime.   Yes Historical Provider, MD  montelukast (SINGULAIR) 10 MG tablet Take 10 mg by mouth at bedtime.   Yes Historical Provider, MD  Multiple Vitamins-Minerals (MULTI FOR HER PO) Take 1 tablet by mouth daily.    Yes Historical Provider, MD  Omega-3 Fatty Acids (FISH OIL) 1200 MG CAPS Take 1 capsule by mouth daily.    Yes Historical Provider, MD  ondansetron (ZOFRAN) 4 MG tablet Take 4 mg by mouth 3 (three) times daily.   Yes Historical Provider, MD   sennosides-docusate sodium (SENOKOT-S) 8.6-50 MG tablet Take 3-6 tablets by mouth at bedtime. Patient states that she takes 3- 6 per night   Yes Historical Provider, MD  traMADol-acetaminophen (ULTRACET) 37.5-325 MG per tablet Take 2 tablets by mouth every 6 (six) hours as needed for moderate pain or severe pain.    Yes Historical Provider, MD  denosumab (PROLIA) 60 MG/ML SOLN injection Inject 60 mg into the skin every 6 (six) months. Administer in upper arm, thigh, or abdomen    Historical Provider, MD    Current Facility-Administered Medications  Medication Dose Route Frequency Provider Last Rate Last Dose  . 0.9 %  sodium chloride infusion   Intravenous Continuous Wilson SingerNimish C Gosrani, MD 100 mL/hr at 09/12/13 0234    . ALPRAZolam Prudy Feeler(XANAX) tablet 0.5 mg  0.5 mg Oral QID Elliot Cousinenise Fisher, MD      . ceFAZolin (ANCEF) IVPB 1 g/50 mL premix  1 g Intravenous 3 times per day Wilson SingerNimish C Gosrani, MD   1 g at 09/12/13 0600  . cromolyn (INTAL) nebulizer solution 20 mg  20 mg Nebulization TID Nimish C Gosrani, MD      . cycloSPORINE (RESTASIS) 0.05 % ophthalmic emulsion 1 drop  1 drop Both Eyes BID Nimish C Gosrani, MD   1 drop at 09/12/13 0945  . heparin injection 5,000 Units  5,000 Units Subcutaneous 3 times per day Wilson SingerNimish C Gosrani, MD   5,000 Units at 09/12/13 0536  . ipratropium (ATROVENT) nebulizer solution 0.5 mg  0.5 mg Nebulization QID Nimish C Karilyn CotaGosrani, MD   0.5 mg at 09/12/13 1134  . morphine 2 MG/ML injection 2 mg  2 mg Intravenous Q3H PRN Wilson SingerNimish C Gosrani, MD   2 mg at 09/12/13 0945  . ondansetron (ZOFRAN) injection 4 mg  4 mg Intravenous Q6H PRN Jinger NeighborsMary A Lynch, NP   4 mg at 09/12/13 0536  . pantoprazole (PROTONIX) injection 40 mg  40 mg Intravenous Q12H Elliot Cousinenise Fisher, MD      . sodium chloride (OCEAN) 0.65 % nasal spray 1 spray  1 spray Each Nare PRN Elliot Cousinenise Fisher, MD        Allergies as of 09/11/2013 - Review Complete 09/11/2013  Allergen Reaction Noted  . Codeine Nausea Only 09/10/2013  .  Levaquin [levofloxacin in d5w] Nausea And Vomiting and Other (See Comments) 09/10/2013  . Septra [sulfamethoxazole-tmp ds] Other (See Comments) 09/10/2013   Family History  Problem Relation Age of Onset  . Alzheimer's disease Mother   . Heart disease Father   . Lupus Brother   . Fibromyalgia Daughter   . Cystic fibrosis Daughter   . Healthy Daughter   . Fibromyalgia Son      History   Social History  . Marital  Status: Widowed    Social History Main Topics  . Smoking status: Never Smoker   . Smokeless tobacco: Never Used  . Alcohol Use: No  . Drug Use: No  . Sexual Activity: Yes    Birth Control/ Protection: Post-menopausal     Review of Systems: PT REQUESTING ANXIETY MEDICINE ATC AND HS. USUALLY TAKES XANAX 0.5 MG QID. PER HPI OTHERWISE ALL SYSTEMS ARE NEGATIVE.   Vitals: Blood pressure 109/49, pulse 64, temperature 98.5 F (36.9 C), temperature source Oral, resp. rate 20, height 5\' 5"  (1.651 m), weight 108 lb 7.5 oz (49.2 kg), SpO2 96.00%.  Physical Exam: General:   Alert, pleasant and cooperative in NAD Head:  Normocephalic and atraumatic. Eyes:  no icterus.   Conjunctiva pink. Neck:  Supple;  Lungs:  Clear throughout to auscultation.   No wheezes. No acute distress. Heart:  Regular rate and rhythm; no murmurs. Abdomen:  Soft, MILD TTP IN THE EPIGASTRIUM, nondistended. Normal bowel sounds, without guarding, and without rebound.   Msk:  Symmetrical  Extremities:  Without  edema. Neurologic:  Alert and  oriented x4;  grossly normal neurologically. Psych:  Alert and cooperative. ANXIOUS mood and FLAT affect.   Lab Results:  Recent Labs  09/11/13 0646 09/11/13 1930 09/12/13 0609  WBC 4.2 9.6 7.1  HGB 12.7 12.7 11.8*  HCT 38.3 37.8 35.1*  PLT 161 178 153   BMET  Recent Labs  09/11/13 1930 09/12/13 0609  NA 139 141  K 4.1 4.0  CL 99 105  CO2 28 24  GLUCOSE 122* 85  BUN 22 15  CREATININE 0.74 0.71  CALCIUM 9.4 8.4   LFT  Recent Labs   09/12/13 0609  PROT 5.6*  ALBUMIN 3.0*  AST 26  ALT 20  ALKPHOS 39  BILITOT 0.6     Studies/Results: AAS: FEB 6-NAIAP, STENTS IN PLACE,CONTRAST IN GALLBLADDER  Impression: ADMITTED WITH POST-ERCP PANCREATITIS AFTER PROLONGED ATTEMPT TO INTUBATE CBD/SPHINCTEROTOMY/STENTS IN PANCREAS AND CBD.  Plan: 1. NPO EXCEPT ICE CHIPS 2. XANAX QID 3. ZOFRAN ATC AND HS FOR NAUSEA 4. HYDRATE 5. ANTICIPATE ADVANCING TO CLEARS IN AM IF NAUSEA, LIPASE, AND ABD PAIN IMPROVED. PT NEEDS GLUTEN FREE DIET. 6. CHECK LIPASE MAR 9. 7. BID PPI    LOS: 1 day   [facility]  09/12/2013, 11:48 AM

## 2013-09-13 DIAGNOSIS — K219 Gastro-esophageal reflux disease without esophagitis: Secondary | ICD-10-CM

## 2013-09-13 DIAGNOSIS — Z9889 Other specified postprocedural states: Secondary | ICD-10-CM

## 2013-09-13 LAB — COMPREHENSIVE METABOLIC PANEL
ALBUMIN: 2.6 g/dL — AB (ref 3.5–5.2)
ALK PHOS: 35 U/L — AB (ref 39–117)
ALT: 14 U/L (ref 0–35)
AST: 23 U/L (ref 0–37)
BUN: 12 mg/dL (ref 6–23)
CO2: 24 mEq/L (ref 19–32)
Calcium: 7.8 mg/dL — ABNORMAL LOW (ref 8.4–10.5)
Chloride: 107 mEq/L (ref 96–112)
Creatinine, Ser: 0.67 mg/dL (ref 0.50–1.10)
GFR calc non Af Amer: 80 mL/min — ABNORMAL LOW (ref >90)
Glucose, Bld: 76 mg/dL (ref 70–99)
POTASSIUM: 3.8 meq/L (ref 3.7–5.3)
Sodium: 140 mEq/L (ref 137–147)
TOTAL PROTEIN: 5.1 g/dL — AB (ref 6.0–8.3)
Total Bilirubin: 0.4 mg/dL (ref 0.3–1.2)

## 2013-09-13 LAB — LIPASE, BLOOD: Lipase: 65 U/L — ABNORMAL HIGH (ref 11–59)

## 2013-09-13 MED ORDER — SENNOSIDES-DOCUSATE SODIUM 8.6-50 MG PO TABS
3.0000 | ORAL_TABLET | Freq: Every day | ORAL | Status: DC
  Administered 2013-09-13: 3 via ORAL
  Filled 2013-09-13: qty 3

## 2013-09-13 MED ORDER — MIRTAZAPINE 30 MG PO TABS
15.0000 mg | ORAL_TABLET | Freq: Every day | ORAL | Status: DC
  Administered 2013-09-13: 15 mg via ORAL
  Filled 2013-09-13: qty 1

## 2013-09-13 MED ORDER — PANTOPRAZOLE SODIUM 40 MG PO TBEC
40.0000 mg | DELAYED_RELEASE_TABLET | Freq: Two times a day (BID) | ORAL | Status: DC
  Administered 2013-09-13 – 2013-09-14 (×2): 40 mg via ORAL
  Filled 2013-09-13 (×2): qty 1

## 2013-09-13 MED ORDER — MONTELUKAST SODIUM 10 MG PO TABS
10.0000 mg | ORAL_TABLET | Freq: Every day | ORAL | Status: DC
  Administered 2013-09-13: 10 mg via ORAL
  Filled 2013-09-13: qty 1

## 2013-09-13 MED ORDER — LATANOPROST 0.005 % OP SOLN
1.0000 [drp] | Freq: Every day | OPHTHALMIC | Status: DC
Start: 2013-09-13 — End: 2013-09-14
  Administered 2013-09-13: 1 [drp] via OPHTHALMIC
  Filled 2013-09-13: qty 2.5

## 2013-09-13 MED ORDER — AMLODIPINE BESYLATE 5 MG PO TABS
5.0000 mg | ORAL_TABLET | Freq: Every day | ORAL | Status: DC
  Administered 2013-09-13 – 2013-09-14 (×2): 5 mg via ORAL
  Filled 2013-09-13 (×2): qty 1

## 2013-09-13 MED ORDER — IPRATROPIUM BROMIDE 0.02 % IN SOLN
0.5000 mg | Freq: Three times a day (TID) | RESPIRATORY_TRACT | Status: DC
Start: 2013-09-13 — End: 2013-09-14
  Administered 2013-09-13 – 2013-09-14 (×4): 0.5 mg via RESPIRATORY_TRACT
  Filled 2013-09-13 (×4): qty 2.5

## 2013-09-13 NOTE — Progress Notes (Signed)
Subjective: Since I last evaluated the patient SHE HAS MILD ABD PAIN. MILD NAUSEA. NO VOMITING. WOULD LIKE SOMETHING TO EAT. HAS NOT HAD A BM SINCE ERCP./ USUALLY USES 3-6 SENNA-K QHS.   Objective: Vital signs in last 24 hours: Temp:  [98 F (36.7 C)-98.6 F (37 C)] 98 F (36.7 C) (03/08 0444) Pulse Rate:  [67-74] 74 (03/08 0444) Resp:  [20] 20 (03/08 0444) BP: (101-140)/(50-67) 134/67 mmHg (03/08 0444) SpO2:  [95 %-97 %] 96 % (03/08 0817) Last BM Date: 09/10/13  Intake/Output from previous day: 03/07 0701 - 03/08 0700 In: 2700 [I.V.:2500; IV Piggyback:200] Out: 800 [Urine:800] Intake/Output this shift:    General appearance: alert, cooperative and no distress Resp: clear to auscultation bilaterally Cardio: regular rate and rhythm GI: soft, non-tender; bowel sounds normal; NON-DISTENDED, NO REBOUND OR GUARDING  Lab Results:  Recent Labs  09/11/13 0646 09/11/13 1930 09/12/13 0609  WBC 4.2 9.6 7.1  HGB 12.7 12.7 11.8*  HCT 38.3 37.8 35.1*  PLT 161 178 153   BMET  Recent Labs  09/11/13 1930 09/12/13 0609 09/13/13 0645  NA 139 141 140  K 4.1 4.0 3.8  CL 99 105 107  CO2 28 24 24   GLUCOSE 122* 85 76  BUN 22 15 12   CREATININE 0.74 0.71 0.67  CALCIUM 9.4 8.4 7.8*   LFT  Recent Labs  09/13/13 0645  PROT 5.1*  ALBUMIN 2.6*  AST 23  ALT 14  ALKPHOS 35*  BILITOT 0.4   PT/INR  Recent Labs  09/11/13 0647  LABPROT 12.0  INR 0.90   Studies/Results: Dg Ercp With Sphincterotomy  09/11/2013   CLINICAL DATA:  Dilated common bile duct stone with gallstones.  EXAM: ERCP  TECHNIQUE: Multiple spot images obtained with the fluoroscopic device and submitted for interpretation post-procedure.  COMPARISON:  None.  FINDINGS: Multiple spot fluoroscopic images are submitted. These were reviewed with Dr. Karilyn Cotaehman following the procedure. The common bile duct is moderately dilated. The distal duct is not well visualized, although no definite filling defects or ulceration  identified. Multiple gallstones are noted within the gallbladder lumen. Balloon pull-through was performed, not yielding any common duct stones. A plastic biliary stent was placed with adequate decompression of the biliary system. A small amount of contrast is noted within the pancreatic duct.  IMPRESSION: Cholelithiasis with biliary dilatation of undetermined etiology. No common duct stones identified radiographically or by endoscopy. Biliary stent placement.  These images were submitted for radiologic interpretation only. Please see the procedural report for the amount of contrast and the fluoroscopy time utilized.   Electronically Signed   By: Roxy HorsemanBill  Veazey M.D.   On: 09/11/2013 11:36   Dg Abd Acute W/chest  09/11/2013   CLINICAL DATA:  ERCP.  Nausea and abdominal pain.  EXAM: ACUTE ABDOMEN SERIES (ABDOMEN 2 VIEW & CHEST 1 VIEW)  COMPARISON:  DG ERCP WITH SPHINCTEROTOMY dated 09/11/2013  FINDINGS: COPD with hyperinflation. Normal cardiomediastinal silhouette. No infiltrates or failure. No effusion or pneumothorax.  Contrast opacified small bowel loops. There are large and small biliary stents in the expected region of the ampulla. The gallbladder remains partially filled with contrast. Bilateral hip replacements. Osseous demineralization. Moderate stool burden.  IMPRESSION: Post ERCP changes as described. Negative abdominal radiographs. No acute cardiopulmonary disease.   Electronically Signed   By: Davonna BellingJohn  Curnes M.D.   On: 09/11/2013 21:09    Medications: I have reviewed the patient's current medications.  Assessment/Plan: ADMITTED WITH POST ERCP PANCREATITIS. CLINICALLY IMPROVED.  PLAN: 1. ADVANCE  DIET 2. D/C MAR 9 IF TOLERATING POS 3. D/C ANCEF 4. DECREASE MIVFs. 5. BID PPI    LOS: 2 days   Gerard Cantara 09/13/2013, 10:15 AM

## 2013-09-13 NOTE — Progress Notes (Signed)
[facility] PROGRESS NOTE  Linda Adkins ZOX:[id number] DOB: 02/09/1929 DOA: 09/11/2013 PCP: Aggie CosierBALAJI DESAI, MD    Code Status: Full code Family Communication: Discussed with her daughter Disposition Plan: Anticipate discharge to home when medically appropriate, likely tomorrow.   Consultants:  Gastroenterology, Dr. Darrick PennaFields  Procedures:  None  Antibiotics:  Cefazolin 09/11/13>>> 09/13/13.  HPI/Subjective: The patient has no complaints of abdominal pain currently. She had nausea earlier, but no vomiting. She was started on a full liquid diet this morning by GI. She ate most of it without abdominal pain or nausea or vomiting.  Objective: Filed Vitals:   09/13/13 0444  BP: 134/67  Pulse: 74  Temp: 98 F (36.7 C)  Resp: 20   oxygen saturation 99% on room air.  Intake/Output Summary (Last 24 hours) at 09/13/13 1436 Last data filed at 09/13/13 0600  Gross per 24 hour  Intake   2700 ml  Output    300 ml  Net   2400 ml   Filed Weights   09/11/13 1745 09/11/13 2307 09/12/13 0614  Weight: 55.339 kg (122 lb) 56.8 kg (125 lb 3.5 oz) 49.2 kg (108 lb 7.5 oz)    Exam:   General:  Pleasant elderly 80 year old Caucasian woman, lying in bed, in no acute distress.  Cardiovascular: S1, S2, with no murmurs rubs or gallops.  Respiratory: Clear to auscultation bilaterally.  Abdomen: Positive bowel sounds, soft, nontender. No rigidity or masses palpated.  Musculoskeletal: No acute hot red joints.  Neurologic/psychiatric: She is alert and oriented x3. Cranial nerves II through XII are intact. Pleasant affect.   Data Reviewed: Basic Metabolic Panel:  Recent Labs Lab 09/11/13 0647 09/11/13 1930 09/12/13 0609 09/13/13 0645  NA 147 139 141 140  K 4.4 4.1 4.0 3.8  CL 107 99 105 107  CO2 31 28 24 24   GLUCOSE 88 122* 85 76  BUN 24* 22 15 12   CREATININE 0.90 0.74 0.71 0.67  CALCIUM 9.5 9.4 8.4 7.8*   Liver Function Tests:  Recent Labs Lab 09/11/13 0647 09/11/13 1930  09/12/13 0609 09/13/13 0645  AST 30 32 26 23  ALT 29 28 20 14   ALKPHOS 47 47 39 35*  BILITOT 0.3 0.5 0.6 0.4  PROT 6.8 6.9 5.6* 5.1*  ALBUMIN 3.5 3.5 3.0* 2.6*    Recent Labs Lab 09/11/13 1930 09/12/13 0609 09/13/13 0645  LIPASE 624* 217* 65*   No results found for this basename: AMMONIA,  in the last 168 hours CBC:  Recent Labs Lab 09/11/13 0646 09/11/13 1930 09/12/13 0609  WBC 4.2 9.6 7.1  NEUTROABS  --  8.2*  --   HGB 12.7 12.7 11.8*  HCT 38.3 37.8 35.1*  MCV 100.0 99.2 99.4  PLT 161 178 153   Cardiac Enzymes: No results found for this basename: CKTOTAL, CKMB, CKMBINDEX, TROPONINI,  in the last 168 hours BNP (last 3 results) No results found for this basename: PROBNP,  in the last 8760 hours CBG: No results found for this basename: GLUCAP,  in the last 168 hours  No results found for this or any previous visit (from the past 240 hour(s)).   Studies: Dg Abd Acute W/chest  09/11/2013   CLINICAL DATA:  ERCP.  Nausea and abdominal pain.  EXAM: ACUTE ABDOMEN SERIES (ABDOMEN 2 VIEW & CHEST 1 VIEW)  COMPARISON:  DG ERCP WITH SPHINCTEROTOMY dated 09/11/2013  FINDINGS: COPD with hyperinflation. Normal cardiomediastinal silhouette. No infiltrates or failure. No effusion or pneumothorax.  Contrast opacified small bowel loops. There  are large and small biliary stents in the expected region of the ampulla. The gallbladder remains partially filled with contrast. Bilateral hip replacements. Osseous demineralization. Moderate stool burden.  IMPRESSION: Post ERCP changes as described. Negative abdominal radiographs. No acute cardiopulmonary disease.   Electronically Signed   By: Davonna Belling M.D.   On: 09/11/2013 21:09    Scheduled Meds: . ALPRAZolam  0.5 mg Oral QID  . cromolyn  20 mg Nebulization TID  . cycloSPORINE  1 drop Both Eyes BID  . ipratropium  0.5 mg Nebulization TID  . ondansetron  4 mg Intravenous TID WC & HS  . pantoprazole  40 mg Oral BID AC  . senna-docusate  3  tablet Oral QHS   Continuous Infusions: . sodium chloride 75 mL/hr at 09/13/13 1016   Assessment:  Principal Problem:   Pancreatitis, acute Active Problems:   Cholelithiasis   S/P ERCP   Common bile duct dilation   GERD (gastroesophageal reflux disease)   Depression   Hypertension   Hyperlipidemia   Chronic anxiety    1. Acute pancreatitis, status post ERCP, sphincterotomy, and stenting on 09/11/13. Her lipase was 624 on admission. It has improved to 65 today. Her liver transaminases are within normal limits. Cefazolin was started empirically, but it was discontinued by GI. She is symptomatically improved. Her diet was advanced by GI. We'll continue gentle IV fluids, analgesics, and antiemetics. Continue PPI. Increase activity laxative added for constipation. Possible discharge tomorrow.   History of dilated common hepatic duct and common bile duct with evidence of cholelithiasis. The patient underwent ERCP with biliary sphincterotomy and biliary and pancreatic stenting by Dr. Karilyn Cota on 09/11/2013. She will likely need a cholecystectomy, but probably not emergently.  GERD. Protonix was changed to by mouth.  Hyperlipidemia. Omega-3 fatty acids are on hold. Fasting lipid panel canceled.  Hypertension. Her blood pressures trending up. Will restart amlodipine. Continue monitoring.  Seasonal allergies. Continue saline nasal spray as needed. Restart antihistamines tomorrow morning.  Chronic depression and anxiety. Continue Xanax.    Plan: 1.Diet advanced to fully close per GI. PPI changed to by mouth. IV fluids decreased. 2. Will restart Norvasc today. 3. Laxative per GI. 4. Ambulate with family or nursing staff.   Time spent: 25 minutes.    [facility]  [facility] Pager [phone]. If 7PM-7AM, please contact night-coverage at [email or web address], password [facility] 09/13/2013, 2:36 PM  LOS: 2 days

## 2013-09-13 NOTE — Anesthesia Postprocedure Evaluation (Signed)
  Anesthesia Post-op Note  Patient: Linda Adkins  Procedure(s) Performed: Procedure(s): ENDOSCOPIC RETROGRADE CHOLANGIOPANCREATOGRAPHY (ERCP) (N/A) SPHINCTEROTOMY (N/A) SPYGLASS CHOLANGIOSCOPY (N/A) PANCREATIC STENT PLACEMENT (N/A) BILIARY STENT PLACEMENT (N/A) BALLOON DILATION (N/A)  Patient Location: Room 318  Anesthesia Type:General  Level of Consciousness: awake, alert , oriented and patient cooperative  Airway and Oxygen Therapy: Patient Spontanous Breathing  Post-op Pain: mild  Post-op Assessment: Post-op Vital signs reviewed, Patient's Cardiovascular Status Stable, Respiratory Function Stable, NAUSEA AND VOMITING PRESENT and Pain level controlled  Post-op Vital Signs: Reviewed and stable  Complications: No apparent anesthesia complications

## 2013-09-14 ENCOUNTER — Encounter (HOSPITAL_COMMUNITY): Payer: Self-pay | Admitting: Internal Medicine

## 2013-09-14 ENCOUNTER — Telehealth: Payer: Self-pay | Admitting: Gastroenterology

## 2013-09-14 DIAGNOSIS — K900 Celiac disease: Secondary | ICD-10-CM

## 2013-09-14 DIAGNOSIS — K838 Other specified diseases of biliary tract: Secondary | ICD-10-CM

## 2013-09-14 DIAGNOSIS — K859 Acute pancreatitis without necrosis or infection, unspecified: Secondary | ICD-10-CM

## 2013-09-14 LAB — LIPASE, BLOOD: LIPASE: 41 U/L (ref 11–59)

## 2013-09-14 MED ORDER — POLYETHYLENE GLYCOL 3350 17 G PO PACK
17.0000 g | PACK | Freq: Every day | ORAL | Status: DC
  Administered 2013-09-14: 17 g via ORAL
  Filled 2013-09-14: qty 1

## 2013-09-14 MED ORDER — BISACODYL 10 MG RE SUPP
10.0000 mg | Freq: Once | RECTAL | Status: AC
  Administered 2013-09-14: 10 mg via RECTAL
  Filled 2013-09-14: qty 1

## 2013-09-14 MED ORDER — POLYETHYLENE GLYCOL 3350 17 G PO PACK: 17.0000 g | PACK | Freq: Every day | ORAL | Status: DC | PRN

## 2013-09-14 NOTE — Care Management Note (Signed)
    Page 1 of 1   09/14/2013     2:00:54 PM   CARE MANAGEMENT NOTE 09/14/2013  Patient:  Linda Adkins,Marit T   Account Number:  [id number]  Date Initiated:  09/14/2013  Documentation initiated by:  Rosemary HolmsOBSON,Nethan Caudillo  Subjective/Objective Assessment:   Pt from home alone. No HH needs identified. No DME either.     Action/Plan:   Anticipated DC Date:  09/14/2013   Anticipated DC Plan:  HOME/SELF CARE      DC Planning Services  CM consult      Choice offered to / List presented to:             Status of service:  Completed, signed off Medicare Important Message given?  YES (If response is "NO", the following Medicare IM given date fields will be blank) Date Medicare IM given:  09/14/2013 Date Additional Medicare IM given:    Discharge Disposition:  HOME/SELF CARE  Per UR Regulation:    If discussed at Long Length of Stay Meetings, dates discussed:    Comments:  09/14/13 Rosemary HolmsAmy Carola Viramontes RN BSN CM

## 2013-09-14 NOTE — Progress Notes (Signed)
    Subjective: No nausea. No vomiting. Feels constipated. States mild abdominal discomfort, about the same. Feels like it did "when I had an ulcer". Points to lower abdomen. Gluten free diet this morning, has not eaten yet. No BM since 3/5.   Objective: Vital signs in last 24 hours: Temp:  [97.9 F (36.6 C)-98.1 F (36.7 C)] 97.9 F (36.6 C) (03/09 0507) Pulse Rate:  [64-71] 64 (03/09 0507) Resp:  [20] 20 (03/09 0507) BP: (97-115)/(58-60) 97/58 mmHg (03/09 0507) SpO2:  [95 %-99 %] 96 % (03/09 0711) Last BM Date: 09/10/13 General:   Alert and oriented, pleasant Head:  Normocephalic and atraumatic. Eyes:  No icterus, sclera clear. Conjuctiva pink.  Heart:  S1, S2 present, no murmurs noted.  Lungs: Clear to auscultation bilaterally, without wheezing, rales, or rhonchi.  Abdomen:  Bowel sounds present, soft, non-tender, non-distended. No HSM or hernias noted. No rebound or guarding. No masses appreciated  Neurologic:  Alert and  oriented x4;  grossly normal neurologically. Psych:  Alert and cooperative. Normal mood and affect.  Intake/Output from previous day: 03/08 0701 - 03/09 0700 In: 1755.8 [P.O.:240; I.V.:1515.8] Out: 700 [Urine:700] Intake/Output this shift:    Lab Results:  Recent Labs  09/11/13 1930 09/12/13 0609  WBC 9.6 7.1  HGB 12.7 11.8*  HCT 37.8 35.1*  PLT 178 153   BMET  Recent Labs  09/11/13 1930 09/12/13 0609 09/13/13 0645  NA 139 141 140  K 4.1 4.0 3.8  CL 99 105 107  CO2 28 24 24   GLUCOSE 122* 85 76  BUN 22 15 12   CREATININE 0.74 0.71 0.67  CALCIUM 9.4 8.4 7.8*   LFT  Recent Labs  09/11/13 1930 09/12/13 0609 09/13/13 0645  PROT 6.9 5.6* 5.1*  ALBUMIN 3.5 3.0* 2.6*  AST 32 26 23  ALT 28 20 14   ALKPHOS 47 39 35*  BILITOT 0.5 0.6 0.4   Lab Results  Component Value Date   LIPASE 41 09/14/2013     Assessment: 80 year old female with pancreatitis s/p ERCP, clinically improving. Gluten free diet ordered for this morning; patient  has not started eating yet. Main complaint of constipation. No improvement with Senna, which she takes as an outpatient.   If tolerates diet today, recommend discharge home with outpatient follow-up with Dr. Karilyn Cotaehman in about 2 weeks.   Plan: Continue gluten free diet Miralax 1 dose now PPI BID Hopeful discharge today if tolerates diet Outpatient follow-up with Dr. Karilyn Cotaehman in 2 weeks. Outpatient surgical consultation for elective cholecystectomy per Dr. Karilyn Cotaehman.    Linda Adkins, ANP-BC [facility]      LOS: 3 days    09/14/2013, 8:59 AM

## 2013-09-14 NOTE — Progress Notes (Signed)
AV'S reviewed with patient and patient's daughter.  Verbalized understanding of discharge instructions, physician follow-up and medications.  Patient's IV removed.  Site WNL.  Patient's IV removed.  Site WNL.  Patient reports all belongings intact and in possession at time of discharge.  Patient transported by NT via w/c to main entrance for discharge.  Patient stable at time of discharge.

## 2013-09-14 NOTE — Progress Notes (Signed)
Patient had medium sized bowel movement.  Dr. Sherrie MustacheFisher notified via text page.

## 2013-09-14 NOTE — Discharge Summary (Signed)
Physician Discharge Summary  Linda Adkins ZOX:[id number] DOB: 1929/03/13 DOA: 09/11/2013  PCP: Aggie Cosier, MD  Admit date: 09/11/2013 Discharge date: 09/14/2013  Time spent: Greater than 30 minutes  Recommendations for Outpatient Follow-up:  1. The patient was instructed to follow a low-fat diet and to followup with Dr. Karilyn Cota as previously scheduled. 2. The patient will eventually need an elective cholecystectomy; this decision will be deferred to Dr. Karilyn Cota.  Discharge Diagnoses:  1. Acute pancreatitis, status post ERCP with stent placement. The patient's lipase was 624 on admission and 41 at the time of discharge. Liver transaminases were within normal limits. 2. Status post ERCP with biliary sphincterotomy and biliary and pancreatic stenting by Dr. Karilyn Cota on 09/11/2013 for dilated common bile duct and common hepatic duct. 3. Cholelithiasis. 4. Gastroesophageal reflux disease. 5. Hypertension. Remained stable. 6. Chronic anxiety, remained stable. 7. Constipation.  Discharge Condition: Improved.  Diet recommendation: Low-fat.  Filed Weights   09/11/13 1745 09/11/13 2307 09/12/13 0614  Weight: 55.339 kg (122 lb) 56.8 kg (125 lb 3.5 oz) 49.2 kg (108 lb 7.5 oz)    History of present illness:  The patient is an 80 year old woman who presented to the emergency department in the evening of 09/11/2013 with a chief complaint of epigastric abdominal pain radiating to between her shoulder blades with associated nausea. Earlier the same morning, she underwent an ERCP with biliary sphincterotomy and biliary and pancreatic stenting for treatment of markedly dilated CHD and CBD. In the emergency department, she was afebrile and hemodynamically stable. Her lipase was 624. Her liver transaminases were within normal limits. Her white blood cell count was within normal limits. She was admitted for further evaluation and management.  Hospital Course:  The patient was started on IV fluid hydration. She was  made to be n.p.o. for bowel rest. IV Protonix was started empirically. Her antihypertensive medications were on hold as her blood pressure was on the low-normal size. Her pain was treated with as needed analgesics and her nausea was treated with as needed antiemetics. Cefazolin was started empirically. Her lipase and liver transaminases were monitored daily. Gastroenterology was consulted. Dr. Darrick Penna, on call for Dr. Dionicia Abler, provided the assessment and recommendations. She agreed with medical management with a few changes. Over the course of the hospitalization, the patient's abdominal pain and nausea subsided and then completely resolved. Her lipase trended downward until it was completely normal at 41 before discharge. Her liver transaminases remained within normal limits. Liquids were started and her diet was advanced as tolerated. She tolerated the advancement well. She complained of constipation and had not had a bowel movement in several days. Therefore MiraLax was started. Eventually, she required a Dulcolax suppository. This was successful in her having a bowel movement.  Her chronic medications were restarted. Her chronic medical conditions remained stable more or less. She was instructed to followup with Dr. Karilyn Cota as previously scheduled.    Procedures:  None  Consultations:  Gastroenterology, Jonette Eva, M.D.  Discharge Exam: Filed Vitals:   09/14/13 0910  BP: 146/71  Pulse: 92  Temp:   Resp:    temperature 97.9. Respiratory rate 20. Oxygen saturation 97% on room air.  General: Pleasant alert 80 year old woman sitting up in bed, in no acute distress. Cardiovascular: S1, S2, with no murmurs rubs or gallops. Respiratory: Clear to auscultation bilaterally. Abdomen: Positive bowel sounds, soft, nontender, nondistended.  Discharge Instructions      Discharge Orders   Future Orders Complete By Expires   Diet - low  sodium heart healthy  As directed    Discharge instructions   As directed    Comments:     FOLLOW A LOW FAT DIET.   Increase activity slowly  As directed        Medication List         ALPRAZolam 1 MG tablet  Commonly known as:  XANAX  Take 0.5 mg by mouth 3 (three) times daily as needed for anxiety. Patient take 1/2 tablet 3-4 times daily     amLODipine 5 MG tablet  Commonly known as:  NORVASC  Take 2.5 mg by mouth 2 (two) times daily. Patient takes 1/2 tablet in the morning , and 1/2 tablet in the pm.     bimatoprost 0.03 % ophthalmic solution  Commonly known as:  LUMIGAN  Place 1 drop into both eyes at bedtime.     Biotin 5000 MCG Caps  Take by mouth daily.     CALCIUM 600 + D 600-200 MG-UNIT Tabs  Generic drug:  Calcium Carb-Cholecalciferol  Take 1 tablet by mouth daily.     CROMOLYN SODIUM IN  Inhale into the lungs 2 (two) times daily. 20 mg/562mL     denosumab 60 MG/ML Soln injection  Commonly known as:  PROLIA  Inject 60 mg into the skin every 6 (six) months. Administer in upper arm, thigh, or abdomen     esomeprazole 40 MG capsule  Commonly known as:  NEXIUM  Take 40 mg by mouth daily at 12 noon.     Fish Oil 1200 MG Caps  Take 1 capsule by mouth daily.     Glucosamine 500 MG Caps  Take by mouth daily.     IPRATROPIUM BROMIDE HFA IN  Inhale 2 puffs into the lungs 4 (four) times daily. 2 puffs  4 times daily     Iron 325 (65 FE) MG Tabs  Take 325 mg by mouth daily.     levocetirizine 5 MG tablet  Commonly known as:  XYZAL  Take 5 mg by mouth every evening.     mirtazapine 15 MG tablet  Commonly known as:  REMERON  Take 15 mg by mouth at bedtime.     montelukast 10 MG tablet  Commonly known as:  SINGULAIR  Take 10 mg by mouth at bedtime.     MULTI FOR HER PO  Take 1 tablet by mouth daily.     OMNARIS 50 MCG/ACT nasal spray  Generic drug:  ciclesonide  Place 2 sprays into both nostrils daily.     ondansetron 4 MG tablet  Commonly known as:  ZOFRAN  Take 4 mg by mouth 3 (three) times daily.      polyethylene glycol packet  Commonly known as:  MIRALAX / GLYCOLAX  Take 17 g by mouth daily as needed.     RESTASIS 0.05 % ophthalmic emulsion  Generic drug:  cycloSPORINE  Place 1 drop into both eyes 2 (two) times daily.     sennosides-docusate sodium 8.6-50 MG tablet  Commonly known as:  SENOKOT-S  Take 3-6 tablets by mouth at bedtime. Patient states that she takes 3- 6 per night     traMADol-acetaminophen 37.5-325 MG per tablet  Commonly known as:  ULTRACET  Take 2 tablets by mouth every 6 (six) hours as needed for moderate pain or severe pain.     TUDORZA PRESSAIR 400 MCG/ACT Aepb  Generic drug:  Aclidinium Bromide  Inhale 1 puff into the lungs 2 (two) times daily.  VITAMIN B 12 PO  Take 2,500 mcg by mouth daily.     Vitamin D-3 1000 UNITS Caps  Take by mouth daily.     VOLTAREN 1 % Gel  Generic drug:  diclofenac sodium  Apply 4 g topically as needed.       Allergies  Allergen Reactions  . Codeine Nausea Only  . Levaquin [Levofloxacin In D5w] Nausea And Vomiting and Other (See Comments)    CAUSED BLINDNESS, SKIN SENSITIVE TO TOUCH  . Septra [Sulfamethoxazole-Tmp Ds] Other (See Comments)    unknown   Follow-up Information   Follow up with Malissa Hippo, MD. (Followup with Dr. Karilyn Cota as previously scheduled)    Specialty:  Gastroenterology   Contact information:   [address], [address] [place] [address] [phone]        The results of significant diagnostics from this hospitalization (including imaging, microbiology, ancillary and laboratory) are listed below for reference.    Significant Diagnostic Studies: Dg Ercp With Sphincterotomy  09/11/2013   CLINICAL DATA:  Dilated common bile duct stone with gallstones.  EXAM: ERCP  TECHNIQUE: Multiple spot images obtained with the fluoroscopic device and submitted for interpretation post-procedure.  COMPARISON:  None.  FINDINGS: Multiple spot fluoroscopic images are submitted. These were reviewed with  Dr. Karilyn Cota following the procedure. The common bile duct is moderately dilated. The distal duct is not well visualized, although no definite filling defects or ulceration identified. Multiple gallstones are noted within the gallbladder lumen. Balloon pull-through was performed, not yielding any common duct stones. A plastic biliary stent was placed with adequate decompression of the biliary system. A small amount of contrast is noted within the pancreatic duct.  IMPRESSION: Cholelithiasis with biliary dilatation of undetermined etiology. No common duct stones identified radiographically or by endoscopy. Biliary stent placement.  These images were submitted for radiologic interpretation only. Please see the procedural report for the amount of contrast and the fluoroscopy time utilized.   Electronically Signed   By: Roxy Horseman M.D.   On: 09/11/2013 11:36   Dg Abd Acute W/chest  09/11/2013   CLINICAL DATA:  ERCP.  Nausea and abdominal pain.  EXAM: ACUTE ABDOMEN SERIES (ABDOMEN 2 VIEW & CHEST 1 VIEW)  COMPARISON:  DG ERCP WITH SPHINCTEROTOMY dated 09/11/2013  FINDINGS: COPD with hyperinflation. Normal cardiomediastinal silhouette. No infiltrates or failure. No effusion or pneumothorax.  Contrast opacified small bowel loops. There are large and small biliary stents in the expected region of the ampulla. The gallbladder remains partially filled with contrast. Bilateral hip replacements. Osseous demineralization. Moderate stool burden.  IMPRESSION: Post ERCP changes as described. Negative abdominal radiographs. No acute cardiopulmonary disease.   Electronically Signed   By: Davonna Belling M.D.   On: 09/11/2013 21:09    Microbiology: No results found for this or any previous visit (from the past 240 hour(s)).   Labs: Basic Metabolic Panel:  Recent Labs Lab 09/11/13 0647 09/11/13 1930 09/12/13 0609 09/13/13 0645  NA 147 139 141 140  K 4.4 4.1 4.0 3.8  CL 107 99 105 107  CO2 31 28 24 24   GLUCOSE 88 122* 85  76  BUN 24* 22 15 12   CREATININE 0.90 0.74 0.71 0.67  CALCIUM 9.5 9.4 8.4 7.8*   Liver Function Tests:  Recent Labs Lab 09/11/13 0647 09/11/13 1930 09/12/13 0609 09/13/13 0645  AST 30 32 26 23  ALT 29 28 20 14   ALKPHOS 47 47 39 35*  BILITOT 0.3 0.5 0.6 0.4  PROT  6.8 6.9 5.6* 5.1*  ALBUMIN 3.5 3.5 3.0* 2.6*    Recent Labs Lab 09/11/13 1930 09/12/13 0609 09/13/13 0645 09/14/13 0554  LIPASE 624* 217* 65* 41   No results found for this basename: AMMONIA,  in the last 168 hours CBC:  Recent Labs Lab 09/11/13 0646 09/11/13 1930 09/12/13 0609  WBC 4.2 9.6 7.1  NEUTROABS  --  8.2*  --   HGB 12.7 12.7 11.8*  HCT 38.3 37.8 35.1*  MCV 100.0 99.2 99.4  PLT 161 178 153   Cardiac Enzymes: No results found for this basename: CKTOTAL, CKMB, CKMBINDEX, TROPONINI,  in the last 168 hours BNP: BNP (last 3 results) No results found for this basename: PROBNP,  in the last 8760 hours CBG: No results found for this basename: GLUCAP,  in the last 168 hours     Signed:  Dorethy Tomey  [facility] 09/14/2013, 1:55 PM

## 2013-09-14 NOTE — Telephone Encounter (Signed)
Recently discharged from hospital after post-ERCP pancreatitis. Needs f/u with Dr. Karilyn Cotaehman in about 2 weeks. Thanks!

## 2013-09-16 ENCOUNTER — Ambulatory Visit (INDEPENDENT_AMBULATORY_CARE_PROVIDER_SITE_OTHER): Payer: Self-pay | Admitting: Internal Medicine

## 2013-09-24 ENCOUNTER — Encounter (HOSPITAL_COMMUNITY)
Admission: RE | Admit: 2013-09-24 | Discharge: 2013-09-24 | Disposition: A | Discharge status: Home / Self Care | Payer: Medicare Other | Source: Ambulatory Visit | Attending: General Surgery | Admitting: General Surgery

## 2013-09-24 ENCOUNTER — Encounter (HOSPITAL_COMMUNITY): Payer: Self-pay

## 2013-09-24 DIAGNOSIS — Z01812 Encounter for preprocedural laboratory examination: Secondary | ICD-10-CM | POA: Insufficient documentation

## 2013-09-24 HISTORY — DX: Acute pancreatitis without necrosis or infection, unspecified: K85.90

## 2013-09-24 NOTE — Pre-Procedure Instructions (Signed)
Clarified with Dr. Lovell SheehanJenkins that he did not wish to repeat lab work.

## 2013-09-24 NOTE — Patient Instructions (Signed)
General Anesthesia, Adult, Care After Refer to this sheet in the next few weeks. These instructions provide you with information on caring for yourself after your procedure. Your health care provider may also give you more specific instructions. Your treatment has been planned according to current medical practices, but problems sometimes occur. Call your health care provider if you have any problems or questions after your procedure. WHAT TO EXPECT AFTER THE PROCEDURE After the procedure, it is typical to experience:  Sleepiness.  Nausea and vomiting. HOME CARE INSTRUCTIONS  For the first 24 hours after general anesthesia:  Have a responsible person with you.  Do not drive a car. If you are alone, do not take public transportation.  Do not drink alcohol.  Do not take medicine that has not been prescribed by your health care provider.  Do not sign important papers or make important decisions.  You may resume a normal diet and activities as directed by your health care provider.  Change bandages (dressings) as directed.  If you have questions or problems that seem related to general anesthesia, call the hospital and ask for the anesthetist or anesthesiologist on call. SEEK MEDICAL CARE IF:  You have nausea and vomiting that continue the day after anesthesia.  You develop a rash. SEEK IMMEDIATE MEDICAL CARE IF:   You have difficulty breathing.  You have chest pain.  You have any allergic problems. Document Released: 10/01/2000 Document Revised: 02/25/2013 Document Reviewed: 01/08/2013 [facility] Patient Information 2014 [place], [place]. Laparoscopic Cholecystectomy, Care After These instructions give you information on caring for yourself after your procedure. Your doctor may also give you more specific instructions. Call your doctor if you have any problems or questions after your procedure.  HOME CARE  Change your bandages (dressings) as told by your doctor.  Keep the  wound dry and clean. Wash the wound gently with soap and water. Pat the wound dry with a clean towel.  Do not take baths, swim, or use hot tubs for 2 weeks, or as told by your doctor.  Only take medicine as told by your doctor.  Eat a normal diet as told by your doctor.  Do not lift anything heavier than 10 pounds (4.5 kg) until your doctor says it is okay.  Do not play contact sports for 1 week, or as told by your doctor. GET HELP IF:  Your wound is red, puffy (swollen), or painful.  You have yellowish-white fluid (pus) coming from the wound.  You have fluid draining from the wound for more than 1 day.  You have a bad smell coming from the wound.  Your wound breaks open. GET HELP RIGHT AWAY IF:   You have a rash.  You have trouble breathing.  You have chest pain.  You have a fever.  You have pain in the shoulders (shoulder strap areas) that is getting worse.  You feel dizzy or pass out (faint).  You have severe belly (abdominal) pain.  You feel sick to your stomach (nauseous) or throw up (vomit) for more than 1 day. Document Released: 04/03/2008 Document Revised: 04/15/2013 Document Reviewed: 02/04/2013 [facility] Patient Information 2014 [place], [place]. Laparoscopic Cholecystectomy Laparoscopic cholecystectomy is surgery to remove the gallbladder. The gallbladder is located in the upper right part of the abdomen, behind the liver. It is a storage sac for bile produced in the liver. Bile aids in the digestion and absorption of fats. Cholecystectomy is often done for inflammation of the gallbladder (cholecystitis). This condition is usually caused by  a buildup of gallstones (cholelithiasis) in your gallbladder. Gallstones can block the flow of bile, resulting in inflammation and pain. In severe cases, emergency surgery may be required. When emergency surgery is not required, you will have time to prepare for the procedure. Laparoscopic surgery is an alternative to open  surgery. Laparoscopic surgery has a shorter recovery time. Your common bile duct may also need to be examined during the procedure. If stones are found in the common bile duct, they may be removed. LET [facility]ARE PROVIDER KNOW ABOUT:  Any allergies you have.  All medicines you are taking, including vitamins, herbs, eye drops, creams, and over-the-counter medicines.  Previous problems you or members of your family have had with the use of anesthetics.  Any blood disorders you have.  Previous surgeries you have had.  Medical conditions you have. RISKS AND COMPLICATIONS Generally, this is a safe procedure. However, as with any procedure, complications can occur. Possible complications include:  Infection.  Damage to the common bile duct, nerves, arteries, veins, or other internal organs such as the stomach, liver, or intestines.  Bleeding.  A stone may remain in the common bile duct.  A bile leak from the cyst duct that is clipped when your gallbladder is removed.  The need to convert to open surgery, which requires a larger incision in the abdomen. This may be necessary if your surgeon thinks it is not safe to continue with a laparoscopic procedure. BEFORE THE PROCEDURE  Ask your health care provider about changing or stopping any regular medicines. You will need to stop taking aspirin or blood thinners at least 5 days prior to surgery.  Do not eat or drink anything after midnight the night before surgery.  Let your health care provider know if you develop a cold or other infectious problem before surgery. PROCEDURE   You will be given medicine to make you sleep through the procedure (general anesthetic). A breathing tube will be placed in your mouth.  When you are asleep, your surgeon will make several small cuts (incisions) in your abdomen.  A thin, lighted tube with a tiny camera on the end (laparoscope) is inserted through one of the small incisions. The camera on the  laparoscope sends a picture to a TV screen in the operating room. This gives the surgeon a good view inside your abdomen.  A gas will be pumped into your abdomen. This expands your abdomen so that the surgeon has more room to perform the surgery.  Other tools needed for the procedure are inserted through the other incisions. The gallbladder is removed through one of the incisions.  After the removal of your gallbladder, the incisions will be closed with stitches, staples, or skin glue. AFTER THE PROCEDURE  You will be taken to a recovery area where your progress will be checked often.  You may be allowed to go home the same day if your pain is controlled and you can tolerate liquids. Document Released: 06/25/2005 Document Revised: 04/15/2013 Document Reviewed: 02/04/2013 [facility] Patient Information 2014 [place].

## 2013-09-25 ENCOUNTER — Encounter (HOSPITAL_COMMUNITY): Payer: Self-pay | Admitting: Pharmacy Technician

## 2013-09-25 NOTE — H&P (Signed)
NTS SOAP Note  Vital Signs:  Vitals as of: 09/24/2013: Systolic 152: Diastolic 92: Heart Rate 95: Temp 99.58F: Height 15ft 5in: Weight 121Lbs 0 Ounces: Pain Level 4: BMI 20.14  BMI : 20.14 kg/m2  Subjective: This [age] Years 548 Months old Female presents for of cholelithiasis.  Has been having right upper quadrant abdominal pain.  Found to have choledocholithiasis as well as cholelithiasis.  ERCP, stone extraction, and stent placement was performed by Dr. Karilyn Cotaehman.  Now referred for cholecystectomy.  Review of Symptoms:  :     Past Medical History:    Reviewed  Past Medical History  Surgical History: ERCP by Dr. Karilyn Cotaehman for choledocholithiasis Medical Problems: high cholesterol, glaucoma, COPD, sleep apnea, arthritis, anemia Psychiatric History:  Depression Allergies: codeine, levaquin, septra   Social History:Reviewed  Social History  Preferred Language: English Race:  White Ethnicity: Not Hispanic / Latino Age: 80 Years 8 Months Marital Status:  S Alcohol: no   Smoking Status: Never smoker reviewed on 09/24/2013 Functional Status reviewed on 09/24/2013 ------------------------------------------------ Bathing: Normal Cooking: Normal Dressing: Normal Driving: Normal Eating: Normal Managing Meds: Normal Oral Care: Normal Shopping: Normal Toileting: Normal Transferring: Normal Walking: Normal Cognitive Status reviewed on 09/24/2013 ------------------------------------------------ Attention: Normal Decision Making: Normal Language: Normal Memory: Normal Motor: Normal Perception: Normal Problem Solving: Normal Visual and Spatial: Normal   Family History:  Reviewed  Family Health History Family History is Unknown    Objective Information: General:  Well appearing, well nourished in no distress.   no scleral icterus Heart:  RRR, no murmur Lungs:    CTA bilaterally, no wheezes, rhonchi, rales.  Breathing unlabored. Abdomen:Soft,  NT/ND, no HSM, no masses.  Assessment:Cholelithiasis  Diagnoses: 574.20 Gallstone (Calculus of gallbladder without cholecystitis without obstruction)  Procedures: [id number] - OFFICE OUTPATIENT NEW 30 MINUTES    Plan:  Scheduled for laparoscopic cholecystectomy on 09/28/13.   Patient Education:Alternative treatments to surgery were discussed with patient (and family).  Risks and benefits  of procedure including bleeding, infection, hepatobiliary injury, and the possibility of an open procedure were fully explained to the patient (and family) who gave informed consent. Patient/family questions were addressed.  Follow-up:Pending Surgery

## 2013-09-28 ENCOUNTER — Encounter (HOSPITAL_COMMUNITY): Payer: Self-pay | Admitting: *Deleted

## 2013-09-28 ENCOUNTER — Ambulatory Visit (HOSPITAL_COMMUNITY): Payer: Medicare Other | Admitting: Anesthesiology

## 2013-09-28 ENCOUNTER — Ambulatory Visit (INDEPENDENT_AMBULATORY_CARE_PROVIDER_SITE_OTHER): Payer: Medicare Other | Admitting: Internal Medicine

## 2013-09-28 ENCOUNTER — Ambulatory Visit (HOSPITAL_COMMUNITY)
Admission: RE | Admit: 2013-09-28 | Discharge: 2013-09-28 | Disposition: A | Discharge status: Home / Self Care | Payer: Medicare Other | Source: Ambulatory Visit | Attending: General Surgery | Admitting: General Surgery

## 2013-09-28 ENCOUNTER — Encounter (HOSPITAL_COMMUNITY)
Admission: RE | Disposition: A | Discharge status: Home / Self Care | Payer: Self-pay | Source: Ambulatory Visit | Attending: General Surgery

## 2013-09-28 ENCOUNTER — Encounter (HOSPITAL_COMMUNITY): Payer: Medicare Other | Admitting: Anesthesiology

## 2013-09-28 DIAGNOSIS — K8010 Calculus of gallbladder with chronic cholecystitis without obstruction: Secondary | ICD-10-CM | POA: Diagnosis not present

## 2013-09-28 DIAGNOSIS — F329 Major depressive disorder, single episode, unspecified: Secondary | ICD-10-CM | POA: Insufficient documentation

## 2013-09-28 DIAGNOSIS — J449 Chronic obstructive pulmonary disease, unspecified: Secondary | ICD-10-CM | POA: Insufficient documentation

## 2013-09-28 DIAGNOSIS — Z79899 Other long term (current) drug therapy: Secondary | ICD-10-CM | POA: Insufficient documentation

## 2013-09-28 DIAGNOSIS — F3289 Other specified depressive episodes: Secondary | ICD-10-CM | POA: Insufficient documentation

## 2013-09-28 DIAGNOSIS — J4489 Other specified chronic obstructive pulmonary disease: Secondary | ICD-10-CM | POA: Insufficient documentation

## 2013-09-28 DIAGNOSIS — Z9889 Other specified postprocedural states: Secondary | ICD-10-CM | POA: Diagnosis not present

## 2013-09-28 DIAGNOSIS — I10 Essential (primary) hypertension: Secondary | ICD-10-CM | POA: Insufficient documentation

## 2013-09-28 HISTORY — PX: CHOLECYSTECTOMY: SHX55

## 2013-09-28 SURGERY — LAPAROSCOPIC CHOLECYSTECTOMY
Anesthesia: General | Site: Abdomen

## 2013-09-28 MED ORDER — FENTANYL CITRATE 0.05 MG/ML IJ SOLN
INTRAMUSCULAR | Status: AC
  Filled 2013-09-28: qty 5

## 2013-09-28 MED ORDER — MIDAZOLAM HCL 2 MG/2ML IJ SOLN
1.0000 mg | INTRAMUSCULAR | Status: DC | PRN
  Administered 2013-09-28: 2 mg via INTRAVENOUS

## 2013-09-28 MED ORDER — ROCURONIUM BROMIDE 50 MG/5ML IV SOLN
INTRAVENOUS | Status: AC
  Filled 2013-09-28: qty 1

## 2013-09-28 MED ORDER — FENTANYL CITRATE 0.05 MG/ML IJ SOLN
INTRAMUSCULAR | Status: AC
  Administered 2013-09-28: 25 ug via INTRAVENOUS
  Filled 2013-09-28: qty 2

## 2013-09-28 MED ORDER — SUCCINYLCHOLINE CHLORIDE 20 MG/ML IJ SOLN
INTRAMUSCULAR | Status: DC | PRN
  Administered 2013-09-28: 140 mg via INTRAVENOUS

## 2013-09-28 MED ORDER — MIDAZOLAM HCL 2 MG/2ML IJ SOLN
INTRAMUSCULAR | Status: AC
  Filled 2013-09-28: qty 2

## 2013-09-28 MED ORDER — SODIUM CHLORIDE 0.9 % IR SOLN
Status: DC | PRN
  Administered 2013-09-28: 1000 mL

## 2013-09-28 MED ORDER — CEFAZOLIN SODIUM-DEXTROSE 2-3 GM-% IV SOLR
2.0000 g | INTRAVENOUS | Status: AC
  Administered 2013-09-28: 2 g via INTRAVENOUS

## 2013-09-28 MED ORDER — BUPIVACAINE HCL (PF) 0.5 % IJ SOLN
INTRAMUSCULAR | Status: DC | PRN
  Administered 2013-09-28: 10 mL

## 2013-09-28 MED ORDER — GLYCOPYRROLATE 0.2 MG/ML IJ SOLN
0.2000 mg | Freq: Once | INTRAMUSCULAR | Status: AC
  Administered 2013-09-28: 0.2 mg via INTRAVENOUS

## 2013-09-28 MED ORDER — GLYCOPYRROLATE 0.2 MG/ML IJ SOLN
INTRAMUSCULAR | Status: AC
  Filled 2013-09-28: qty 1

## 2013-09-28 MED ORDER — ONDANSETRON HCL 4 MG/2ML IJ SOLN
4.0000 mg | Freq: Once | INTRAMUSCULAR | Status: AC
  Administered 2013-09-28: 4 mg via INTRAVENOUS

## 2013-09-28 MED ORDER — ETOMIDATE 2 MG/ML IV SOLN
INTRAVENOUS | Status: AC
  Filled 2013-09-28: qty 10

## 2013-09-28 MED ORDER — FENTANYL CITRATE 0.05 MG/ML IJ SOLN
INTRAMUSCULAR | Status: DC | PRN
  Administered 2013-09-28: 25 ug via INTRAVENOUS
  Administered 2013-09-28: 50 ug via INTRAVENOUS
  Administered 2013-09-28: 25 ug via INTRAVENOUS
  Administered 2013-09-28 (×3): 50 ug via INTRAVENOUS

## 2013-09-28 MED ORDER — LACTATED RINGERS IV SOLN
INTRAVENOUS | Status: DC
  Administered 2013-09-28: 08:00:00 via INTRAVENOUS

## 2013-09-28 MED ORDER — NEOSTIGMINE METHYLSULFATE 1 MG/ML IJ SOLN
INTRAMUSCULAR | Status: DC | PRN
  Administered 2013-09-28: 1 mg via INTRAVENOUS

## 2013-09-28 MED ORDER — MIDAZOLAM HCL 5 MG/5ML IJ SOLN
INTRAMUSCULAR | Status: DC | PRN
  Administered 2013-09-28 (×2): 1 mg via INTRAVENOUS

## 2013-09-28 MED ORDER — ROCURONIUM BROMIDE 100 MG/10ML IV SOLN
INTRAVENOUS | Status: DC | PRN
  Administered 2013-09-28: 20 mg via INTRAVENOUS

## 2013-09-28 MED ORDER — BUPIVACAINE HCL (PF) 0.5 % IJ SOLN
INTRAMUSCULAR | Status: AC
  Filled 2013-09-28: qty 30

## 2013-09-28 MED ORDER — POVIDONE-IODINE 10 % OINT PACKET
TOPICAL_OINTMENT | CUTANEOUS | Status: DC | PRN
  Administered 2013-09-28: 1 via TOPICAL

## 2013-09-28 MED ORDER — ONDANSETRON HCL 4 MG/2ML IJ SOLN
4.0000 mg | Freq: Once | INTRAMUSCULAR | Status: DC | PRN
Start: 2013-09-28 — End: 2013-09-28

## 2013-09-28 MED ORDER — CHLORHEXIDINE GLUCONATE 4 % EX LIQD: 1.0000 "application " | Freq: Once | CUTANEOUS | Status: DC

## 2013-09-28 MED ORDER — SUCCINYLCHOLINE CHLORIDE 20 MG/ML IJ SOLN
INTRAMUSCULAR | Status: AC
  Filled 2013-09-28: qty 1

## 2013-09-28 MED ORDER — ETOMIDATE 2 MG/ML IV SOLN
INTRAVENOUS | Status: DC | PRN
  Administered 2013-09-28: 10 mg via INTRAVENOUS

## 2013-09-28 MED ORDER — POVIDONE-IODINE 10 % EX OINT
TOPICAL_OINTMENT | CUTANEOUS | Status: AC
  Filled 2013-09-28: qty 2

## 2013-09-28 MED ORDER — CEFAZOLIN SODIUM-DEXTROSE 2-3 GM-% IV SOLR
INTRAVENOUS | Status: AC
  Filled 2013-09-28: qty 50

## 2013-09-28 MED ORDER — GLYCOPYRROLATE 0.2 MG/ML IJ SOLN
INTRAMUSCULAR | Status: AC
  Filled 2013-09-28: qty 2

## 2013-09-28 MED ORDER — LIDOCAINE HCL (PF) 1 % IJ SOLN
INTRAMUSCULAR | Status: AC
  Filled 2013-09-28: qty 5

## 2013-09-28 MED ORDER — TRAMADOL-ACETAMINOPHEN 37.5-325 MG PO TABS: 1.0000 | ORAL_TABLET | Freq: Four times a day (QID) | ORAL | Status: DC | PRN

## 2013-09-28 MED ORDER — LIDOCAINE HCL 1 % IJ SOLN
INTRAMUSCULAR | Status: DC | PRN
  Administered 2013-09-28: 50 mg via INTRADERMAL

## 2013-09-28 MED ORDER — ONDANSETRON HCL 4 MG/2ML IJ SOLN
INTRAMUSCULAR | Status: AC
  Filled 2013-09-28: qty 2

## 2013-09-28 MED ORDER — FENTANYL CITRATE 0.05 MG/ML IJ SOLN
25.0000 ug | INTRAMUSCULAR | Status: DC | PRN
Start: 2013-09-28 — End: 2013-09-28
  Administered 2013-09-28: 25 ug via INTRAVENOUS
  Administered 2013-09-28: 50 ug via INTRAVENOUS
  Administered 2013-09-28: 25 ug via INTRAVENOUS

## 2013-09-28 MED ORDER — KETOROLAC TROMETHAMINE 30 MG/ML IJ SOLN
INTRAMUSCULAR | Status: AC
Start: 2013-09-28 — End: 2013-09-28
  Filled 2013-09-28: qty 1

## 2013-09-28 MED ORDER — HEMOSTATIC AGENTS (NO CHARGE) OPTIME
TOPICAL | Status: DC | PRN
  Administered 2013-09-28: 1 via TOPICAL

## 2013-09-28 MED ORDER — GLYCOPYRROLATE 0.2 MG/ML IJ SOLN
INTRAMUSCULAR | Status: DC | PRN
  Administered 2013-09-28 (×2): 0.2 mg via INTRAVENOUS

## 2013-09-28 MED ORDER — KETOROLAC TROMETHAMINE 30 MG/ML IJ SOLN
30.0000 mg | Freq: Once | INTRAMUSCULAR | Status: AC
  Administered 2013-09-28: 30 mg via INTRAVENOUS

## 2013-09-28 SURGICAL SUPPLY — 47 items
APPLIER CLIP LAPSCP 10X32 DD (CLIP) ×3 IMPLANT
BAG HAMPER (MISCELLANEOUS) ×3 IMPLANT
CLOTH BEACON ORANGE TIMEOUT ST (SAFETY) ×3 IMPLANT
COVER LIGHT HANDLE STERIS (MISCELLANEOUS) ×6 IMPLANT
CUTTER LINEAR ENDO 35 ETS TH (STAPLE) ×3 IMPLANT
DECANTER SPIKE VIAL GLASS SM (MISCELLANEOUS) ×3 IMPLANT
DURAPREP 26ML APPLICATOR (WOUND CARE) ×3 IMPLANT
ELECT REM PT RETURN 9FT ADLT (ELECTROSURGICAL) ×3
ELECTRODE REM PT RTRN 9FT ADLT (ELECTROSURGICAL) ×1 IMPLANT
FILTER SMOKE EVAC LAPAROSHD (FILTER) ×3 IMPLANT
FORMALIN 10 PREFIL 120ML (MISCELLANEOUS) ×3 IMPLANT
GLOVE BIO SURGEON STRL SZ7.5 (GLOVE) ×3 IMPLANT
GLOVE BIOGEL PI IND STRL 7.0 (GLOVE) ×2 IMPLANT
GLOVE BIOGEL PI IND STRL 7.5 (GLOVE) ×1 IMPLANT
GLOVE BIOGEL PI IND STRL 8 (GLOVE) ×1 IMPLANT
GLOVE BIOGEL PI INDICATOR 7.0 (GLOVE) ×4
GLOVE BIOGEL PI INDICATOR 7.5 (GLOVE) ×2
GLOVE BIOGEL PI INDICATOR 8 (GLOVE) ×2
GLOVE ECLIPSE 6.5 STRL STRAW (GLOVE) ×3 IMPLANT
GLOVE EXAM NITRILE MD LF STRL (GLOVE) ×3 IMPLANT
GLOVE SS BIOGEL STRL SZ 6.5 (GLOVE) ×1 IMPLANT
GLOVE SUPERSENSE BIOGEL SZ 6.5 (GLOVE) ×2
GOWN STRL REUS W/TWL LRG LVL3 (GOWN DISPOSABLE) ×9 IMPLANT
HEMOSTAT SNOW SURGICEL 2X4 (HEMOSTASIS) ×3 IMPLANT
INST SET LAPROSCOPIC AP (KITS) ×3 IMPLANT
IV NS IRRIG 3000ML ARTHROMATIC (IV SOLUTION) IMPLANT
KIT ROOM TURNOVER APOR (KITS) ×3 IMPLANT
MANIFOLD NEPTUNE II (INSTRUMENTS) ×3 IMPLANT
NEEDLE INSUFFLATION 14GA 120MM (NEEDLE) ×3 IMPLANT
NS IRRIG 1000ML POUR BTL (IV SOLUTION) ×3 IMPLANT
PACK LAP CHOLE LZT030E (CUSTOM PROCEDURE TRAY) ×3 IMPLANT
PAD ARMBOARD 7.5X6 YLW CONV (MISCELLANEOUS) ×3 IMPLANT
POUCH SPECIMEN RETRIEVAL 10MM (ENDOMECHANICALS) ×3 IMPLANT
SET BASIN LINEN APH (SET/KITS/TRAYS/PACK) ×3 IMPLANT
SET TUBE IRRIG SUCTION NO TIP (IRRIGATION / IRRIGATOR) IMPLANT
SLEEVE ENDOPATH XCEL 5M (ENDOMECHANICALS) ×3 IMPLANT
SPONGE GAUZE 2X2 8PLY STER LF (GAUZE/BANDAGES/DRESSINGS) ×4
SPONGE GAUZE 2X2 8PLY STRL LF (GAUZE/BANDAGES/DRESSINGS) ×8 IMPLANT
STAPLER VISISTAT (STAPLE) ×3 IMPLANT
SUT VICRYL 0 UR6 27IN ABS (SUTURE) ×3 IMPLANT
TAPE CLOTH SURG 4X10 WHT LF (GAUZE/BANDAGES/DRESSINGS) ×3 IMPLANT
TROCAR ENDO BLADELESS 11MM (ENDOMECHANICALS) ×3 IMPLANT
TROCAR XCEL NON-BLD 5MMX100MML (ENDOMECHANICALS) ×3 IMPLANT
TROCAR XCEL UNIV SLVE 11M 100M (ENDOMECHANICALS) ×3 IMPLANT
TUBING INSUFFLATION (TUBING) ×3 IMPLANT
WARMER LAPAROSCOPE (MISCELLANEOUS) ×3 IMPLANT
YANKAUER SUCT 12FT TUBE ARGYLE (SUCTIONS) ×3 IMPLANT

## 2013-09-28 NOTE — Op Note (Signed)
Patient:  Linda Adkins  DOB:  04/08/1929  MRN:  [id number]   Preop Diagnosis:  Cholecystitis, cholelithiasis  Postop Diagnosis:  Same  Procedure:  Laparoscopic cholecystectomy  Surgeon:  Franky MachoMark Savon Bordonaro, M.D.  Anes:  General endotracheal  Indications:  Patient is an 80 year old white female with a known history of cholelithiasis, status post ERCP with stone extraction and stent placement by Dr. Karilyn Cotaehman who presents for laparoscopic cholecystectomy. The risks and benefits of the procedure including bleeding, infection, hepatobiliary injury, the possibility of an open procedure were fully explained to the patient, who gave informed consent.  Procedure note:  The patient is placed the supine position. After induction of general endotracheal anesthesia, the abdomen was prepped and draped using usual sterile technique with DuraPrep. Surgical site confirmation was performed.  An infraumbilical incision was made down to the fascia. A Veress need was introduced into the abdominal cavity and confirmation of placement was done using the saline drop test. The abdomen was then insufflated to 16 mm mercury pressure. An 11 mm trocar was introduced into the abdominal cavity under direct visualization without difficulty. The patient was placed in reverse Trendelenburg position and additional 11 mm trocar was placed the epigastric region and 5 mm trochars placed were upper quadrant adn right flank regions. The liver was inspected and noted to be within normal limits. The gallbladder was retracted in a dynamic fashion in order to expose the triangle of Calot. The gallbladder was freed away initially from the liver bed in a dome down approach. The cystic duct was first identified. Its junction to the infundibulum was fully identified. It was just large enough that an Endo Clip would not fit. Thus, a vascular Endo GIA was placed across the base the cystic duct and fired. The cystic artery was ligated and divided. The  gallbladder was freed away from the gallbladder fossa using Bovie electrocautery. The gallbladder was delivered through the epigastric trocar site using an Endo Catch bag. The gallbladder fossa was inspected and no abnormal bleeding or bile leakage was noted. Surgicel is placed the gallbladder fossa. All fluid and air were then evacuated from the abdominal cavity prior to removal of the trochars.  All wounds were irrigated with normal saline. All wounds were injected with 0.5% Sensorcaine. The infraumbilical fashion as well as epigastric fascia were reapproximated using 0 Vicryl interrupted sutures. All skin incisions were closed using staples. Betadine ointment and dry sterile dressings were applied.  All tape and needle counts were correct at the end of the procedure. Patient was extubated in the operating room and transferred to PACU in stable condition.  Complications:  None  EBL:  Minimal  Specimen:  Gallbladder

## 2013-09-28 NOTE — Anesthesia Preprocedure Evaluation (Addendum)
Anesthesia Evaluation  Patient identified by MRN, date of birth, ID band Patient awake    Reviewed: Allergy & Precautions, H&P , NPO status , Patient's Chart, lab work & pertinent test results  Airway Mallampati: II TM Distance: >3 FB Neck ROM: Full    Dental  (+) Teeth Intact   Pulmonary sleep apnea and Oxygen sleep apnea , COPD COPD inhaler,  breath sounds clear to auscultation        Cardiovascular hypertension, Pt. on medications Rhythm:Regular Rate:Normal     Neuro/Psych PSYCHIATRIC DISORDERS Depression    GI/Hepatic GERD-  Medicated and Controlled,  Endo/Other    Renal/GU      Musculoskeletal   Abdominal   Peds  Hematology  (+) anemia ,   Anesthesia Other Findings   Reproductive/Obstetrics                           Anesthesia Physical Anesthesia Plan  ASA: III  Anesthesia Plan: General   Post-op Pain Management:    Induction: Intravenous, Rapid sequence and Cricoid pressure planned  Airway Management Planned: Oral ETT  Additional Equipment:   Intra-op Plan:   Post-operative Plan: Extubation in OR  Informed Consent: I have reviewed the patients History and Physical, chart, labs and discussed the procedure including the risks, benefits and alternatives for the proposed anesthesia with the patient or authorized representative who has indicated his/her understanding and acceptance.     Plan Discussed with:   Anesthesia Plan Comments: (amidate induction)        Anesthesia Quick Evaluation  

## 2013-09-28 NOTE — Interval H&P Note (Signed)
History and Physical Interval Note:  09/28/2013 8:16 AM  Linda Adkins  has presented today for surgery, with the diagnosis of cholelithiasis  The various methods of treatment have been discussed with the patient and family. After consideration of risks, benefits and other options for treatment, the patient has consented to  Procedure(s): LAPAROSCOPIC CHOLECYSTECTOMY (N/A) as a surgical intervention .  The patient's history has been reviewed, patient examined, no change in status, stable for surgery.  I have reviewed the patient's chart and labs.  Questions were answered to the patient's satisfaction.     Franky MachoJENKINS,Lorilei Horan A

## 2013-09-28 NOTE — Transfer of Care (Signed)
Immediate Anesthesia Transfer of Care Note  Patient: Linda Adkins  Procedure(s) Performed: Procedure(s): LAPAROSCOPIC CHOLECYSTECTOMY (N/A)  Patient Location: PACU  Anesthesia Type:General  Level of Consciousness: awake, alert , oriented and patient cooperative  Airway & Oxygen Therapy: Patient Spontanous Breathing and Patient connected to face mask oxygen  Post-op Assessment: Report given to PACU RN, Post -op Vital signs reviewed and stable and Patient moving all extremities  Post vital signs: Reviewed and stable  Complications: No apparent anesthesia complications

## 2013-09-28 NOTE — Anesthesia Procedure Notes (Signed)
Procedure Name: Intubation Date/Time: 09/28/2013 9:01 AM Performed by: Despina HiddenIDACAVAGE, Gracilyn Gunia J Pre-anesthesia Checklist: Suction available, Patient being monitored, Emergency Drugs available and Patient identified Patient Re-evaluated:Patient Re-evaluated prior to inductionOxygen Delivery Method: Circle system utilized Preoxygenation: Pre-oxygenation with 100% oxygen Intubation Type: IV induction, Rapid sequence and Cricoid Pressure applied Ventilation: Mask ventilation without difficulty Laryngoscope Size: Mac and 3 Grade View: Grade II Tube type: Oral Number of attempts: 1 Airway Equipment and Method: Stylet and Oral airway Placement Confirmation: ETT inserted through vocal cords under direct vision and breath sounds checked- equal and bilateral Secured at: 22 cm Tube secured with: Tape Dental Injury: Teeth and Oropharynx as per pre-operative assessment

## 2013-09-28 NOTE — Discharge Instructions (Signed)
Laparoscopic Cholecystectomy, Care After °Refer to this sheet in the next few weeks. These instructions provide you with information on caring for yourself after your procedure. Your health care provider may also give you more specific instructions. Your treatment has been planned according to current medical practices, but problems sometimes occur. Call your health care provider if you have any problems or questions after your procedure. °WHAT TO EXPECT AFTER THE PROCEDURE °After your procedure, it is typical to have the following: °· Pain at your incision sites. You will be given pain medicines to control the pain. °· Mild nausea or vomiting. This should improve after the first 24 hours. °· Bloating and possibly shoulder pain from the gas used during the procedure. This will improve after the first 24 hours. °HOME CARE INSTRUCTIONS  °· Change bandages (dressings) as directed by your health care provider. °· Keep the wound dry and clean. You may wash the wound gently with soap and water. Gently blot or dab the area dry. °· Do not take baths or use swimming pools or hot tubs for 2 weeks or until your health care provider approves. °· Only take over-the-counter or prescription medicines as directed by your health care provider. °· Continue your normal diet as directed by your health care provider. °· Do not lift anything heavier than 10 pounds (4.5 kg) until your health care provider approves. °· Do not play contact sports for 1 week or until your health care provider approves. °SEEK MEDICAL CARE IF:  °· You have redness, swelling, or increasing pain in the wound. °· You notice yellowish-white fluid (pus) coming from the wound. °· You have drainage from the wound that lasts longer than 1 day. °· You notice a bad smell coming from the wound or dressing. °· Your surgical cuts (incisions) break open. °SEEK IMMEDIATE MEDICAL CARE IF:  °· You develop a rash. °· You have difficulty breathing. °· You have chest pain. °· You  have a fever. °· You have increasing pain in the shoulders (shoulder strap areas). °· You have dizzy episodes or faint while standing. °· You have severe abdominal pain. °· You feel sick to your stomach (nauseous) or throw up (vomit) and this lasts for more than 1 day. °Document Released: 06/25/2005 Document Revised: 04/15/2013 Document Reviewed: 02/04/2013 °[employer]® Patient Information ©2014 [employer]. ° °

## 2013-09-28 NOTE — Anesthesia Postprocedure Evaluation (Signed)
  Anesthesia Post-op Note  Patient: Linda Adkins  Procedure(s) Performed: Procedure(s): LAPAROSCOPIC CHOLECYSTECTOMY (N/A)  Patient Location: PACU  Anesthesia Type:General  Level of Consciousness: awake, alert , oriented and patient cooperative  Airway and Oxygen Therapy: Patient Spontanous Breathing  Post-op Pain: 3 /10, mild  Post-op Assessment: Post-op Vital signs reviewed, Patient's Cardiovascular Status Stable, Respiratory Function Stable, Patent Airway, No signs of Nausea or vomiting and Pain level controlled  Post-op Vital Signs: Reviewed and stable  Complications: No apparent anesthesia complications

## 2013-09-29 ENCOUNTER — Encounter (HOSPITAL_COMMUNITY): Payer: Self-pay | Admitting: General Surgery

## 2013-10-05 ENCOUNTER — Encounter (INDEPENDENT_AMBULATORY_CARE_PROVIDER_SITE_OTHER): Payer: Self-pay | Admitting: *Deleted

## 2013-10-13 ENCOUNTER — Telehealth (INDEPENDENT_AMBULATORY_CARE_PROVIDER_SITE_OTHER): Payer: Self-pay | Admitting: *Deleted

## 2013-10-13 NOTE — Telephone Encounter (Signed)
Patient is on recall for repeat ERCP this month, will this be with: Spyglass, Sphincterotomy, or stone removal and I think she will need pancreatic stent removal and will she need specific labs at time of pre-op

## 2013-10-13 NOTE — Telephone Encounter (Signed)
ERCP this or next month with stent removal . She will need LFTs and metabolic 7

## 2013-10-14 ENCOUNTER — Other Ambulatory Visit (INDEPENDENT_AMBULATORY_CARE_PROVIDER_SITE_OTHER): Payer: Self-pay | Admitting: *Deleted

## 2013-10-14 ENCOUNTER — Encounter (INDEPENDENT_AMBULATORY_CARE_PROVIDER_SITE_OTHER): Payer: Self-pay | Admitting: *Deleted

## 2013-10-14 DIAGNOSIS — Z4689 Encounter for fitting and adjustment of other specified devices: Secondary | ICD-10-CM

## 2013-10-14 DIAGNOSIS — K831 Obstruction of bile duct: Secondary | ICD-10-CM

## 2013-10-14 NOTE — Telephone Encounter (Signed)
ERCP sch'd 11/18/13 @ 830 (730) -- pre-op 5/7 @ 11, patient aware

## 2013-10-26 ENCOUNTER — Encounter (INDEPENDENT_AMBULATORY_CARE_PROVIDER_SITE_OTHER): Payer: Self-pay | Admitting: *Deleted

## 2013-10-26 ENCOUNTER — Ambulatory Visit (INDEPENDENT_AMBULATORY_CARE_PROVIDER_SITE_OTHER): Payer: Medicare Other | Admitting: Internal Medicine

## 2013-10-26 ENCOUNTER — Encounter (INDEPENDENT_AMBULATORY_CARE_PROVIDER_SITE_OTHER): Payer: Self-pay | Admitting: Internal Medicine

## 2013-10-26 VITALS — BP 128/80 | HR 74 | Temp 98.6°F | Resp 18 | Ht 65.0 in | Wt 119.2 lb

## 2013-10-26 DIAGNOSIS — K432 Incisional hernia without obstruction or gangrene: Secondary | ICD-10-CM

## 2013-10-26 DIAGNOSIS — R1013 Epigastric pain: Secondary | ICD-10-CM

## 2013-10-26 DIAGNOSIS — R110 Nausea: Secondary | ICD-10-CM

## 2013-10-26 LAB — COMPREHENSIVE METABOLIC PANEL
ALT: 21 U/L (ref 0–35)
AST: 30 U/L (ref 0–37)
Albumin: 4.1 g/dL (ref 3.5–5.2)
Alkaline Phosphatase: 81 U/L (ref 39–117)
BUN: 20 mg/dL (ref 6–23)
CALCIUM: 9.1 mg/dL (ref 8.4–10.5)
CHLORIDE: 104 meq/L (ref 96–112)
CO2: 28 meq/L (ref 19–32)
CREATININE: 0.84 mg/dL (ref 0.50–1.10)
GLUCOSE: 98 mg/dL (ref 70–99)
Potassium: 4 mEq/L (ref 3.5–5.3)
SODIUM: 143 meq/L (ref 135–145)
TOTAL PROTEIN: 6.4 g/dL (ref 6.0–8.3)
Total Bilirubin: 0.4 mg/dL (ref 0.2–1.2)

## 2013-10-26 LAB — CBC
HCT: 38.1 % (ref 36.0–46.0)
HEMOGLOBIN: 12.6 g/dL (ref 12.0–15.0)
MCH: 31.4 pg (ref 26.0–34.0)
MCHC: 33.1 g/dL (ref 30.0–36.0)
MCV: 95 fL (ref 78.0–100.0)
PLATELETS: 224 10*3/uL (ref 150–400)
RBC: 4.01 MIL/uL (ref 3.87–5.11)
RDW: 13.7 % (ref 11.5–15.5)
WBC: 6.1 10*3/uL (ref 4.0–10.5)

## 2013-10-26 LAB — AMYLASE: AMYLASE: 72 U/L (ref 0–105)

## 2013-10-26 MED ORDER — ONDANSETRON HCL 4 MG PO TABS: 4.0000 mg | ORAL_TABLET | Freq: Three times a day (TID) | ORAL | Status: DC

## 2013-10-26 MED ORDER — FISH OIL 1200 MG PO CAPS: 2.0000 | ORAL_CAPSULE | Freq: Every day | ORAL | Status: AC

## 2013-10-26 NOTE — Progress Notes (Signed)
Presenting complaint;  Epigastric pain, nausea and painful swelling at epigastrium.  Database;  Patient is a 80 year old Caucasian female who was evaluated last month for nausea, anorexia and weight loss. She [place] she was found to have dilated biliary system and cholelithiasis. ERCP was recommended prior to cholecystectomy. Patient underwent ERCP on 09/11/2013. She had dilated biliary system with distal tapering. No filling defects were noted. Pancreatic duct was normal. Pancreatic stent was placed for prophylaxis. Biliary sphincterotomy was performed and then the stent was also placed. She developed pancreatitis and was briefly hospitalized. She underwent laparoscopic cholecystectomy by Dr. Lovell SheehanJenkins on 09/28/2013 which was uneventful. A few days after surgery she did develop a knot or a bump in midepigastric region at site of laparoscopy wound. She was treated with antibiotic. She is scheduled have biliary stent removed next month.  Subjective;   Patient is here for scheduled visit accompanied by her daughter Lupita LeashDonna. She states she does not feel well. She continues to complain of nausea. She states ondansetron and does help the symptom but only for a while. In spite of daily nausea she has not experienced vomiting. She continues to complain of painful lump or swelling in midepigastric region. She says it makes her nauseated and makes her very nervous. She states she is eating better since she has been on mirtazapine. She has lost 2 pounds since her last visit 6 weeks ago. She had lost 9 prior to that visit. She denies fever chills or chest pain. Her bowels are moving regularly and she denies melena or rectal bleeding. She is not sure if she has passed a pancreatic stent.           Current Medications: Outpatient Encounter Prescriptions as of 10/26/2013  Medication Sig  . Aclidinium Bromide (TUDORZA PRESSAIR) 400 MCG/ACT AEPB Inhale 1 puff  into the lungs 2 (two) times daily.  Marland Kitchen. ALPRAZolam (XANAX) 1 MG tablet Take 0.5 mg by mouth 3 (three) times daily as needed for anxiety. Patient take 1/2 tablet 3-4 times daily  . amLODipine (NORVASC) 5 MG tablet Take 2.5 mg by mouth 2 (two) times daily. Patient takes 1/2 tablet in the morning , and 1/2 tablet in the pm.  . bimatoprost (LUMIGAN) 0.03 % ophthalmic solution Place 1 drop into both eyes at bedtime.  . Biotin 5000 MCG CAPS Take by mouth daily.  . Calcium Carb-Cholecalciferol (CALCIUM 600 + D) 600-200 MG-UNIT TABS Take 1 tablet by mouth daily.   . Cholecalciferol (VITAMIN D-3) 1000 UNITS CAPS Take by mouth daily.  . ciclesonide (OMNARIS) 50 MCG/ACT nasal spray Place 2 sprays into both nostrils daily.  Marland Kitchen. CROMOLYN SODIUM IN Inhale into the lungs 2 (two) times daily. 20 mg/302mL  . Cyanocobalamin (VITAMIN B 12 PO) Take 2,500 mcg by mouth daily.  . cycloSPORINE (RESTASIS) 0.05 % ophthalmic emulsion Place 1 drop into both eyes 2 (two) times daily.  Marland Kitchen. denosumab (PROLIA) 60 MG/ML SOLN injection Inject 60 mg into the skin every 6 (six) months. Administer in upper arm, thigh, or abdomen  . diclofenac sodium (VOLTAREN) 1 % GEL Apply 4 g topically as needed.   Marland Kitchen. esomeprazole (NEXIUM) 40 MG capsule Take 40 mg by mouth daily at 12 noon.  . Ferrous Sulfate (IRON) 325 (65 FE) MG TABS Take 325 mg by mouth daily.   . Glucosamine 500 MG CAPS Take by mouth daily.  . IPRATROPIUM BROMIDE HFA IN Inhale 2 puffs into the lungs 4 (four) times  daily. 2 puffs  4 times daily  . levocetirizine (XYZAL) 5 MG tablet Take 5 mg by mouth every evening.  . mirtazapine (REMERON) 15 MG tablet Take 15 mg by mouth at bedtime.  . montelukast (SINGULAIR) 10 MG tablet Take 10 mg by mouth at bedtime.  . Multiple Vitamins-Minerals (MULTI FOR HER PO) Take 1 tablet by mouth daily.   . Omega-3 Fatty Acids (FISH OIL) 1200 MG CAPS Take 1 capsule by mouth daily. Patient states that she takes 5 daily  . ondansetron (ZOFRAN) 4 MG tablet  Take 4 mg by mouth 3 (three) times daily.  . polyethylene glycol (MIRALAX / GLYCOLAX) packet Take 17 g by mouth daily as needed.  . sennosides-docusate sodium (SENOKOT-S) 8.6-50 MG tablet Take 3-6 tablets by mouth at bedtime. Patient states that she takes 3- 6 per night  . traMADol-acetaminophen (ULTRACET) 37.5-325 MG per tablet Take 1-2 tablets by mouth every 6 (six) hours as needed for moderate pain or severe pain.     Objective: Blood pressure 128/80, pulse 74, temperature 98.6 F (37 C), temperature source Oral, resp. rate 18, height 5\' 5"  (1.651 m), weight 119 lb 3.2 oz (54.069 kg). Patient is alert and extremely anxious. Conjunctiva is pink. Sclera is nonicteric Oropharyngeal mucosa is normal. No neck masses or thyromegaly noted. Cardiac exam with regular rhythm normal S1 and S2. No murmur or gallop noted. Lungs are clear to auscultation. Abdomen symmetrical. Well-healed laparoscopy scars. She has but 2 cm lump in mid epigastrium right with a scar is. Cough impulse is positive. It is mildly tender but reducible. No organomegaly or masses noted. No LE edema or clubbing noted.    Assessment:  #1.Papillary stenosis. She is S/P ERCP with ES and biliary and pancreatic stenting on 09/11/2013 complicated by mild pancreatitis.she also had cholecystectomy 4 weeks ago. She remains with epigastric pain which is possibly due to small hernia at laprascopy site. This hernia would not explain all her symptoms. #2. Nausea without vomiting possibly multifactorial . Possibilities include medications, depression or underlying biliary tract disease. #3. Small incisional hernia. This hernia is causing lot of distress and may have to fixed.    Plan:  CBC, serum amylase and chemistry panel. A/P CT with contrast. Discontinue biotin, glucosamine and feSO4. Decrease fish oil to two capsules daily. Ondansetron refilled. ERCP with stent removal next month.

## 2013-10-26 NOTE — Patient Instructions (Signed)
Physician will call with results of blood work and  CT scan when completed.

## 2013-10-29 ENCOUNTER — Encounter (HOSPITAL_COMMUNITY): Payer: Self-pay

## 2013-10-29 ENCOUNTER — Ambulatory Visit (HOSPITAL_COMMUNITY)
Admission: RE | Admit: 2013-10-29 | Discharge: 2013-10-29 | Disposition: A | Discharge status: Home / Self Care | Payer: Medicare Other | Source: Ambulatory Visit | Attending: Internal Medicine | Admitting: Internal Medicine

## 2013-10-29 ENCOUNTER — Other Ambulatory Visit (INDEPENDENT_AMBULATORY_CARE_PROVIDER_SITE_OTHER): Payer: Self-pay | Admitting: Internal Medicine

## 2013-10-29 DIAGNOSIS — I7090 Unspecified atherosclerosis: Secondary | ICD-10-CM | POA: Insufficient documentation

## 2013-10-29 DIAGNOSIS — K5730 Diverticulosis of large intestine without perforation or abscess without bleeding: Secondary | ICD-10-CM | POA: Insufficient documentation

## 2013-10-29 DIAGNOSIS — R630 Anorexia: Secondary | ICD-10-CM | POA: Insufficient documentation

## 2013-10-29 DIAGNOSIS — K838 Other specified diseases of biliary tract: Secondary | ICD-10-CM | POA: Insufficient documentation

## 2013-10-29 DIAGNOSIS — R1013 Epigastric pain: Secondary | ICD-10-CM

## 2013-10-29 DIAGNOSIS — I2510 Atherosclerotic heart disease of native coronary artery without angina pectoris: Secondary | ICD-10-CM | POA: Insufficient documentation

## 2013-10-29 DIAGNOSIS — R110 Nausea: Secondary | ICD-10-CM | POA: Insufficient documentation

## 2013-10-29 DIAGNOSIS — Z9089 Acquired absence of other organs: Secondary | ICD-10-CM | POA: Insufficient documentation

## 2013-10-29 DIAGNOSIS — R634 Abnormal weight loss: Secondary | ICD-10-CM | POA: Insufficient documentation

## 2013-10-29 MED ORDER — SUCRALFATE 1 G PO TABS: 1.0000 g | ORAL_TABLET | Freq: Three times a day (TID) | ORAL | Status: AC

## 2013-10-29 MED ORDER — IOHEXOL 300 MG/ML  SOLN
100.0000 mL | Freq: Once | INTRAMUSCULAR | Status: AC | PRN
  Administered 2013-10-29: 100 mL via INTRAVENOUS

## 2013-11-04 ENCOUNTER — Encounter (HOSPITAL_COMMUNITY): Payer: Self-pay | Admitting: Pharmacy Technician

## 2013-11-10 ENCOUNTER — Encounter (HOSPITAL_COMMUNITY)
Admission: RE | Admit: 2013-11-10 | Discharge: 2013-11-10 | Disposition: A | Discharge status: Home / Self Care | Payer: Medicare Other | Source: Ambulatory Visit | Attending: Internal Medicine | Admitting: Internal Medicine

## 2013-11-10 ENCOUNTER — Encounter (HOSPITAL_COMMUNITY): Payer: Self-pay

## 2013-11-10 NOTE — Patient Instructions (Addendum)
Your procedure is scheduled on: 11/18/2013  [facility]   [facility]  AM.  Call this number if you have problems the morning of surgery: [phone]   Remember:   Do not drink or eat food:After Midnight.  :  Take these medicines the morning of surgery with A SIP OF WATER: Amlodipine, Nexium, Singulair and xanax.  Use inhalers    Do not wear jewelry, make-up or nail polish.  Do not wear lotions, powders, or perfumes. You may wear deodorant.  Do not shave 48 hours prior to surgery. Men may shave face and neck.  Do not bring valuables to the hospital.  Contacts, dentures or bridgework may not be worn into surgery.  Leave suitcase in the car. After surgery it may be brought to your room.  For patients admitted to the hospital, checkout time is 11:00 AM the day of discharge.   Patients discharged the day of surgery will not be allowed to drive home.     Please read over the following fact sheets that you were given: Pain Booklet, MRSA Information, Surgical Site Infection Prevention and Care and Recovery After Surgery   Endoscopic Retrograde Cholangiopancreatography (ERCP) Endoscopic retrograde cholangiopancreatography (ERCP) is a procedure used to diagnosis many diseases of the pancreas, bile ducts, liver, and gallbladder. During ERCP a thin, lighted tube (endoscope) is passed through the mouth and down the back of the throat into the first part of the small intestine (duodenum). A small, plastic tube (cannula) is then passed through the endoscope and directed into the bile duct or pancreatic duct. Dye is then injected through the cannula and X-rays are taken to study the biliary and pancreatic passageways.  LET [facility] PROVIDER KNOW ABOUT:   Any allergies you have.   All medicines you are taking, including vitamins, herbs, eyedrops, creams, and over-the-counter medicines.   Previous problems you or members of your family have had with the use of anesthetics.   Any blood  disorders you have.   Previous surgeries you have had.   Medical conditions you have. RISKS AND COMPLICATIONS Generally, ERCP is a safe procedure. However, as with any procedure, complications can occur. A simple removal of gallstones has the lowest rate of complications. Higher rates of complication occur in people who have poorly functioning bile or pancreatic ducts. Possible complications include:   Pancreatitis.  Bleeding.  Accidental punctures in the bowel wall, pancreas, or gall bladder.  Gall bladder or bile duct infection. BEFORE THE PROCEDURE   Do not eat or drink anything, including water, for at least 8 hours before the procedure or as directed by your health care provider.   Ask your health care provider whether you should stop taking certain medicines prior to your procedure.   Arrange for someone to drive you home. You will not be allowed to drive for 12 24 hours after the procedure. PROCEDURE   You will be given medicine through a vein (intravenously) to make you relaxed and sleepy.   You might have a breathing tube placed to give you medicine that makes you sleep (general anesthetic).   Your throat may be sprayed with medicine that numbs the area and prevents gagging (local anesthetic), or you may gargle this medicine.   You will lie on your left side.   The endoscope will be inserted through your mouth and into the duodenum. The tube will not interfere with your breathing. Gagging is prevented by the anesthesia.   While X-rays are being  taken, you may be positioned on your stomach.   A small sample of tissue (biopsy) may be removed for examination. AFTER THE PROCEDURE   You will rest in bed until you are fully conscious.   When you first wake up, your throat may feel slightly sore.   You will not be allowed to eat or drink until numbness subsides.   Once you are able to drink, urinate, and sit on the edge of the bed without feeling sick to  your stomach (nauseous) or dizzy, you may be allowed to go home. Document Released: 03/20/2001 Document Revised: 04/15/2013 Document Reviewed: 02/03/2013 [facility] Patient Information 2014 [place], [place].  General Anesthesia, Adult, Care After Refer to this sheet in the next few weeks. These instructions provide you with information on caring for yourself after your procedure. Your health care provider may also give you more specific instructions. Your treatment has been planned according to current medical practices, but problems sometimes occur. Call your health care provider if you have any problems or questions after your procedure. WHAT TO EXPECT AFTER THE PROCEDURE After the procedure, it is typical to experience:  Sleepiness.  Nausea and vomiting. HOME CARE INSTRUCTIONS  For the first 24 hours after general anesthesia:  Have a responsible person with you.  Do not drive a car. If you are alone, do not take public transportation.  Do not drink alcohol.  Do not take medicine that has not been prescribed by your health care provider.  Do not sign important papers or make important decisions.  You may resume a normal diet and activities as directed by your health care provider.  Change bandages (dressings) as directed.  If you have questions or problems that seem related to general anesthesia, call the hospital and ask for the anesthetist or anesthesiologist on call. SEEK MEDICAL CARE IF:  You have nausea and vomiting that continue the day after anesthesia.  You develop a rash. SEEK IMMEDIATE MEDICAL CARE IF:   You have difficulty breathing.  You have chest pain.  You have any allergic problems. Document Released: 10/01/2000 Document Revised: 02/25/2013 Document Reviewed: 01/08/2013 [facility] Patient Information 2014 [place], [place].

## 2013-11-12 ENCOUNTER — Inpatient Hospital Stay (HOSPITAL_COMMUNITY): Admission: RE | Admit: 2013-11-12 | Payer: Medicare Other | Source: Ambulatory Visit

## 2013-11-18 ENCOUNTER — Encounter (HOSPITAL_COMMUNITY): Payer: Self-pay | Admitting: *Deleted

## 2013-11-18 ENCOUNTER — Encounter (HOSPITAL_COMMUNITY)
Admission: RE | Disposition: A | Discharge status: Home / Self Care | Payer: Self-pay | Source: Ambulatory Visit | Attending: Internal Medicine

## 2013-11-18 ENCOUNTER — Ambulatory Visit (HOSPITAL_COMMUNITY): Payer: Medicare Other

## 2013-11-18 ENCOUNTER — Ambulatory Visit (HOSPITAL_COMMUNITY): Payer: Medicare Other | Admitting: Anesthesiology

## 2013-11-18 ENCOUNTER — Ambulatory Visit (HOSPITAL_COMMUNITY)
Admission: RE | Admit: 2013-11-18 | Discharge: 2013-11-18 | Disposition: A | Discharge status: Home / Self Care | Payer: Medicare Other | Source: Ambulatory Visit | Attending: Internal Medicine | Admitting: Internal Medicine

## 2013-11-18 ENCOUNTER — Encounter (HOSPITAL_COMMUNITY): Payer: Medicare Other | Admitting: Anesthesiology

## 2013-11-18 DIAGNOSIS — K319 Disease of stomach and duodenum, unspecified: Secondary | ICD-10-CM | POA: Insufficient documentation

## 2013-11-18 DIAGNOSIS — Z9089 Acquired absence of other organs: Secondary | ICD-10-CM | POA: Insufficient documentation

## 2013-11-18 DIAGNOSIS — Z79899 Other long term (current) drug therapy: Secondary | ICD-10-CM | POA: Insufficient documentation

## 2013-11-18 DIAGNOSIS — K219 Gastro-esophageal reflux disease without esophagitis: Secondary | ICD-10-CM | POA: Insufficient documentation

## 2013-11-18 DIAGNOSIS — K900 Celiac disease: Secondary | ICD-10-CM | POA: Insufficient documentation

## 2013-11-18 DIAGNOSIS — R112 Nausea with vomiting, unspecified: Secondary | ICD-10-CM | POA: Insufficient documentation

## 2013-11-18 DIAGNOSIS — Z883 Allergy status to other anti-infective agents status: Secondary | ICD-10-CM | POA: Insufficient documentation

## 2013-11-18 DIAGNOSIS — H409 Unspecified glaucoma: Secondary | ICD-10-CM | POA: Insufficient documentation

## 2013-11-18 DIAGNOSIS — K2960 Other gastritis without bleeding: Secondary | ICD-10-CM

## 2013-11-18 DIAGNOSIS — D649 Anemia, unspecified: Secondary | ICD-10-CM | POA: Insufficient documentation

## 2013-11-18 DIAGNOSIS — I10 Essential (primary) hypertension: Secondary | ICD-10-CM | POA: Insufficient documentation

## 2013-11-18 DIAGNOSIS — K279 Peptic ulcer, site unspecified, unspecified as acute or chronic, without hemorrhage or perforation: Secondary | ICD-10-CM | POA: Insufficient documentation

## 2013-11-18 DIAGNOSIS — R110 Nausea: Secondary | ICD-10-CM

## 2013-11-18 DIAGNOSIS — G4730 Sleep apnea, unspecified: Secondary | ICD-10-CM | POA: Insufficient documentation

## 2013-11-18 DIAGNOSIS — Z882 Allergy status to sulfonamides status: Secondary | ICD-10-CM | POA: Insufficient documentation

## 2013-11-18 DIAGNOSIS — J4489 Other specified chronic obstructive pulmonary disease: Secondary | ICD-10-CM | POA: Insufficient documentation

## 2013-11-18 DIAGNOSIS — Z4682 Encounter for fitting and adjustment of non-vascular catheter: Secondary | ICD-10-CM | POA: Insufficient documentation

## 2013-11-18 DIAGNOSIS — E785 Hyperlipidemia, unspecified: Secondary | ICD-10-CM | POA: Insufficient documentation

## 2013-11-18 DIAGNOSIS — F3289 Other specified depressive episodes: Secondary | ICD-10-CM | POA: Insufficient documentation

## 2013-11-18 DIAGNOSIS — R1013 Epigastric pain: Secondary | ICD-10-CM | POA: Insufficient documentation

## 2013-11-18 DIAGNOSIS — F329 Major depressive disorder, single episode, unspecified: Secondary | ICD-10-CM | POA: Insufficient documentation

## 2013-11-18 DIAGNOSIS — K831 Obstruction of bile duct: Secondary | ICD-10-CM

## 2013-11-18 DIAGNOSIS — J449 Chronic obstructive pulmonary disease, unspecified: Secondary | ICD-10-CM | POA: Insufficient documentation

## 2013-11-18 DIAGNOSIS — Z4689 Encounter for fitting and adjustment of other specified devices: Secondary | ICD-10-CM

## 2013-11-18 HISTORY — PX: STENT REMOVAL: SHX6421

## 2013-11-18 HISTORY — PX: ERCP: SHX5425

## 2013-11-18 HISTORY — PX: BALLOON DILATION: SHX5330

## 2013-11-18 HISTORY — PX: ESOPHAGOGASTRODUODENOSCOPY: SHX5428

## 2013-11-18 SURGERY — ERCP, WITH INTERVENTION IF INDICATED
Anesthesia: General | Site: Esophagus

## 2013-11-18 MED ORDER — FENTANYL CITRATE 0.05 MG/ML IJ SOLN
INTRAMUSCULAR | Status: AC
  Filled 2013-11-18: qty 2

## 2013-11-18 MED ORDER — GLYCOPYRROLATE 0.2 MG/ML IJ SOLN
INTRAMUSCULAR | Status: DC | PRN
  Administered 2013-11-18: 0.4 mg via INTRAVENOUS

## 2013-11-18 MED ORDER — CEFAZOLIN SODIUM-DEXTROSE 2-3 GM-% IV SOLR
2.0000 g | INTRAVENOUS | Status: AC
  Administered 2013-11-18: 2 g via INTRAVENOUS

## 2013-11-18 MED ORDER — ETOMIDATE 2 MG/ML IV SOLN
INTRAVENOUS | Status: DC | PRN
  Administered 2013-11-18: 10 mg via INTRAVENOUS
  Administered 2013-11-18: 4 mg via INTRAVENOUS

## 2013-11-18 MED ORDER — LACTATED RINGERS IV SOLN
INTRAVENOUS | Status: DC
Start: 2013-11-18 — End: 2013-11-18
  Administered 2013-11-18: 08:00:00 via INTRAVENOUS

## 2013-11-18 MED ORDER — STERILE WATER FOR IRRIGATION IR SOLN
Status: DC | PRN
  Administered 2013-11-18: 09:00:00

## 2013-11-18 MED ORDER — NEOSTIGMINE METHYLSULFATE 10 MG/10ML IV SOLN
INTRAVENOUS | Status: AC
  Filled 2013-11-18: qty 1

## 2013-11-18 MED ORDER — ONDANSETRON HCL 4 MG/2ML IJ SOLN
INTRAMUSCULAR | Status: AC
  Filled 2013-11-18: qty 2

## 2013-11-18 MED ORDER — NEOSTIGMINE METHYLSULFATE 10 MG/10ML IV SOLN
INTRAVENOUS | Status: DC | PRN
  Administered 2013-11-18: 2 mg via INTRAVENOUS

## 2013-11-18 MED ORDER — FENTANYL CITRATE 0.05 MG/ML IJ SOLN
INTRAMUSCULAR | Status: DC | PRN
  Administered 2013-11-18 (×2): 25 ug via INTRAVENOUS

## 2013-11-18 MED ORDER — DEXAMETHASONE SODIUM PHOSPHATE 4 MG/ML IJ SOLN
INTRAMUSCULAR | Status: AC
  Filled 2013-11-18: qty 1

## 2013-11-18 MED ORDER — ONDANSETRON HCL 4 MG/2ML IJ SOLN
4.0000 mg | Freq: Once | INTRAMUSCULAR | Status: AC
  Administered 2013-11-18: 4 mg via INTRAVENOUS

## 2013-11-18 MED ORDER — ROCURONIUM BROMIDE 100 MG/10ML IV SOLN
INTRAVENOUS | Status: DC | PRN
  Administered 2013-11-18: 20 mg via INTRAVENOUS
  Administered 2013-11-18: 5 mg via INTRAVENOUS

## 2013-11-18 MED ORDER — SODIUM CHLORIDE 0.9 % IV SOLN
INTRAVENOUS | Status: DC
Start: 2013-11-18 — End: 2013-11-18
  Filled 2013-11-18: qty 100

## 2013-11-18 MED ORDER — GLYCOPYRROLATE 0.2 MG/ML IJ SOLN
INTRAMUSCULAR | Status: AC
  Filled 2013-11-18: qty 2

## 2013-11-18 MED ORDER — GLYCOPYRROLATE 0.2 MG/ML IJ SOLN
INTRAMUSCULAR | Status: AC
  Filled 2013-11-18: qty 1

## 2013-11-18 MED ORDER — FENTANYL CITRATE 0.05 MG/ML IJ SOLN: 25.0000 ug | INTRAMUSCULAR | Status: DC | PRN

## 2013-11-18 MED ORDER — ONDANSETRON HCL 4 MG/2ML IJ SOLN: 4.0000 mg | Freq: Once | INTRAMUSCULAR | Status: DC | PRN

## 2013-11-18 MED ORDER — SODIUM CHLORIDE 0.9 % IV SOLN
INTRAVENOUS | Status: DC | PRN
  Administered 2013-11-18: 09:00:00

## 2013-11-18 MED ORDER — SIMETHICONE 40 MG/0.6ML PO SUSP
ORAL | Status: AC
  Filled 2013-11-18: qty 1.2

## 2013-11-18 MED ORDER — ROCURONIUM BROMIDE 50 MG/5ML IV SOLN
INTRAVENOUS | Status: AC
  Filled 2013-11-18: qty 1

## 2013-11-18 MED ORDER — MIDAZOLAM HCL 2 MG/2ML IJ SOLN
INTRAMUSCULAR | Status: AC
  Filled 2013-11-18: qty 2

## 2013-11-18 MED ORDER — GLUCAGON HCL (RDNA) 1 MG IJ SOLR
INTRAMUSCULAR | Status: AC
  Filled 2013-11-18: qty 2

## 2013-11-18 MED ORDER — ETOMIDATE 2 MG/ML IV SOLN
INTRAVENOUS | Status: AC
  Filled 2013-11-18: qty 10

## 2013-11-18 MED ORDER — GLYCOPYRROLATE 0.2 MG/ML IJ SOLN
0.2000 mg | Freq: Once | INTRAMUSCULAR | Status: AC
Start: 2013-11-18 — End: 2013-11-18
  Administered 2013-11-18: 0.2 mg via INTRAVENOUS

## 2013-11-18 MED ORDER — DEXAMETHASONE SODIUM PHOSPHATE 4 MG/ML IJ SOLN
4.0000 mg | Freq: Once | INTRAMUSCULAR | Status: AC
  Administered 2013-11-18: 4 mg via INTRAVENOUS

## 2013-11-18 MED ORDER — CEFAZOLIN SODIUM-DEXTROSE 2-3 GM-% IV SOLR
INTRAVENOUS | Status: AC
  Filled 2013-11-18: qty 50

## 2013-11-18 MED ORDER — MIDAZOLAM HCL 2 MG/2ML IJ SOLN
1.0000 mg | INTRAMUSCULAR | Status: DC | PRN
  Administered 2013-11-18: 2 mg via INTRAVENOUS

## 2013-11-18 SURGICAL SUPPLY — 28 items
BAG HAMPER (MISCELLANEOUS) ×3 IMPLANT
BALLN RETRIEVAL 12X15 (BALLOONS) IMPLANT
BALLN RETRIEVAL 12X15MM (BALLOONS)
BALLN RETRIEVAL 9-12 (BALLOONS) ×3 IMPLANT
BASKET TRAPEZOID 3X6 (MISCELLANEOUS) ×3 IMPLANT
DEVICE INFLATION ENCORE 26 (MISCELLANEOUS) IMPLANT
DEVICE LOCKING W-BIOPSY CAP (MISCELLANEOUS) ×3 IMPLANT
FORCEPS BIOP RJ4 1.8 (CUTTING FORCEPS) ×3 IMPLANT
GUIDEWIRE HYDRA JAGWIRE .35 (WIRE) IMPLANT
GUIDEWIRE JAG HINI 025X260CM (WIRE) IMPLANT
KIT CLEAN ENDO COMPLIANCE (KITS) ×3 IMPLANT
KIT ROOM TURNOVER APOR (KITS) ×3 IMPLANT
LUBRICANT JELLY 4.5OZ STERILE (MISCELLANEOUS) ×3 IMPLANT
NEEDLE HYPO 18GX1.5 BLUNT FILL (NEEDLE) IMPLANT
PAD ARMBOARD 7.5X6 YLW CONV (MISCELLANEOUS) ×3 IMPLANT
PATHFINDER 450CM 0.18 (STENTS) IMPLANT
POSITIONER HEAD 8X9X4 ADT (SOFTGOODS) IMPLANT
SNARE ROTATE MED OVAL 20MM (MISCELLANEOUS) ×3 IMPLANT
SPHINCTEROTOME AUTOTOME .25 (MISCELLANEOUS) IMPLANT
SPHINCTEROTOME HYDRATOME 44 (MISCELLANEOUS) ×6 IMPLANT
SPONGE GAUZE 4X4 12PLY (GAUZE/BANDAGES/DRESSINGS) ×3 IMPLANT
SYR 3ML LL SCALE MARK (SYRINGE) IMPLANT
SYR 50ML LL SCALE MARK (SYRINGE) ×6 IMPLANT
SYSTEM CONTINUOUS INJECTION (MISCELLANEOUS) ×3 IMPLANT
TUBING ENDO SMARTCAP PENTAX (MISCELLANEOUS) ×3 IMPLANT
WALLSTENT METAL COVERED 10X60 (STENTS) IMPLANT
WALLSTENT METAL COVERED 10X80 (STENTS) IMPLANT
WATER STERILE IRR 1000ML POUR (IV SOLUTION) ×3 IMPLANT

## 2013-11-18 NOTE — Discharge Instructions (Signed)
Resume usual medications. Clear liquids today and usual diet starting in a.m. No driving for 24 hours. Physician will call with biopsy results.   Endoscopic Retrograde Cholangiopancreatography (ERCP), Care After Refer to this sheet in the next few weeks. These instructions provide you with information on caring for yourself after your procedure. Your health care provider may also give you more specific instructions. Your treatment has been planned according to current medical practices, but problems sometimes occur. Call your health care provider if you have any problems or questions after your procedure.  WHAT TO EXPECT AFTER THE PROCEDURE  After your procedure, it is typical to feel:   Soreness in your throat.   Sick to your stomach (nauseous).   Bloated.  Dizzy.   Fatigued. HOME CARE INSTRUCTIONS  Have a friend or family member stay with you for the first 24 hours after your procedure.  Start taking your usual medicines and eating normally as soon as you feel well enough to do so or as directed by your health care provider. SEEK MEDICAL CARE IF:  You have abdominal pain.   You develop signs of infection, such as:   Chills.   Feeling unwell.  SEEK IMMEDIATE MEDICAL CARE IF:  You have difficulty swallowing.  You have worsening throat, chest, or abdominal pain.  You vomit.  You have bloody or very black stools.  You have a fever. Document Released: 04/15/2013 Document Reviewed: 12/29/2012 [facility] Patient Information 2014 [place], [place].   PATIENT INSTRUCTIONS POST-ANESTHESIA  IMMEDIATELY FOLLOWING SURGERY:  Do not drive or operate machinery for the first twenty four hours after surgery.  Do not make any important decisions for twenty four hours after surgery or while taking narcotic pain medications or sedatives.  If you develop intractable nausea and vomiting or a severe headache please notify your doctor immediately.  FOLLOW-UP:  Please make an  appointment with your surgeon as instructed. You do not need to follow up with anesthesia unless specifically instructed to do so.  WOUND CARE INSTRUCTIONS (if applicable):  Keep a dry clean dressing on the anesthesia/puncture wound site if there is drainage.  Once the wound has quit draining you may leave it open to air.  Generally you should leave the bandage intact for twenty four hours unless there is drainage.  If the epidural site drains for more than 36-48 hours please call the anesthesia department.  QUESTIONS?:  Please feel free to call your physician or the hospital operator if you have any questions, and they will be happy to assist you.

## 2013-11-18 NOTE — Transfer of Care (Signed)
Immediate Anesthesia Transfer of Care Note  Patient: Linda Adkins  Procedure(s) Performed: Procedure(s): ENDOSCOPIC RETROGRADE CHOLANGIOPANCREATOGRAPHY (ERCP) (N/A) STENT REMOVAL (N/A) BALLOON DILATION (N/A) ESOPHAGOGASTRODUODENOSCOPY (EGD) with biopsy (N/A)  Patient Location: PACU  Anesthesia Type:General  Level of Consciousness: sedated and patient cooperative  Airway & Oxygen Therapy: Patient Spontanous Breathing and non-rebreather face mask  Post-op Assessment: Report given to PACU RN, Post -op Vital signs reviewed and stable and Patient moving all extremities  Post vital signs: Reviewed and stable  Complications: No apparent anesthesia complications

## 2013-11-18 NOTE — H&P (Signed)
Linda Adkins is an 80 y.o. female.   Chief Complaint: Patient is here for ERCP and stent removal. HPI: Patient is an 80 year old Caucasian female who was initially evaluated over 2 months ago for dilated biliary system and cholelithiasis. She presented with nausea anorexia and vague abdominal pain. She underwent ERCP on 09/11/2013 revealing markedly dilated biliary system with distal tapering but no evidence of choledocholithiasis. Gallbladder was full of stones. Biliary sphincterotomy was performed and stent was placed along with placement of pancreatic stent for prophylaxis. Patient developed mild pancreatitis and was hospitalized for a few days. She underwent laparoscopic cholecystectomy on 09/28/2013. She was seen in the office about 3 weeks ago complaining of epigastric pain and she was noted to have a small incisional hernia. CBC, serum amylase and comprehensive chemistry panel was normal. She also underwent abdominopelvic CT which did not reveal fluid collection or changes of pancreatitis. She is not feeling knots in the epigastric region that she was feeling before. She still has intermittent epigastric pain and nausea. She noted some relief of sucralfate but she reports Mylanta helps more than anything else. She is convinced that she has peptic ulcer disease. She denies vomiting melena or rectal bleeding. She hasn't lost any more weight since her symptoms began. She has celiac disease and remains on gluten-free diet   Past Medical History  Diagnosis Date  . Hypertension   . Celiac disease   . Seasonal allergies   . GERD (gastroesophageal reflux disease)   . Hyperlipemia   . Depression   . Dexamethasone adverse reaction   . Glaucoma   . COPD (chronic obstructive pulmonary disease)   . Sleep apnea     o2 at night. No CPAP.  Marland Kitchen. Arthritis   . Anemia   . Cholelithiasis 09/12/2013  . Common bile duct dilation 09/12/2013  . Chronic anxiety 09/12/2013  . Pancreatitis, acute     Past Surgical  History  Procedure Laterality Date  . Colonoscopy  12/29/2010  . Upper gastrointestinal endoscopy  12/29/10  . Total hip arthroplasty Bilateral 04/13/10,2008  . Appendectomy    . Hernia repair      umbilical  . Cataract extraction, bilateral    . Joint replacement    . Ercp w/ sphicterotomy  09/11/13    And stent placement  . Ercp N/A 09/11/2013    Procedure: ENDOSCOPIC RETROGRADE CHOLANGIOPANCREATOGRAPHY (ERCP);  Surgeon: Malissa HippoNajeeb U Guerry Covington, MD;  Location: [facility];  Service: Endoscopy;  Laterality: N/A;  . Sphincterotomy N/A 09/11/2013    Procedure: SPHINCTEROTOMY;  Surgeon: Malissa HippoNajeeb U Iliany Losier, MD;  Location: [facility];  Service: Endoscopy;  Laterality: N/A;  . Spyglass cholangioscopy N/A 09/11/2013    Procedure: SPYGLASS CHOLANGIOSCOPY;  Surgeon: Malissa HippoNajeeb U Brighton Pilley, MD;  Location: [facility];  Service: Endoscopy;  Laterality: N/A;  . Pancreatic stent placement N/A 09/11/2013    Procedure: PANCREATIC STENT PLACEMENT;  Surgeon: Malissa HippoNajeeb U Khristie Sak, MD;  Location: [facility];  Service: Endoscopy;  Laterality: N/A;  . Biliary stent placement N/A 09/11/2013    Procedure: BILIARY STENT PLACEMENT;  Surgeon: Malissa HippoNajeeb U Anamika Kueker, MD;  Location: [facility];  Service: Endoscopy;  Laterality: N/A;  . Balloon dilation N/A 09/11/2013    Procedure: BALLOON DILATION;  Surgeon: Malissa HippoNajeeb U Rosebud Koenen, MD;  Location: [facility];  Service: Endoscopy;  Laterality: N/A;  . Cholecystectomy N/A 09/28/2013    Procedure: LAPAROSCOPIC CHOLECYSTECTOMY;  Surgeon: Dalia HeadingMark A Jenkins, MD;  Location: [facility];  Service: General;  Laterality: N/A;    Family History  Problem Relation  Age of Onset  . Alzheimer's disease Mother   . Heart disease Father   . Lupus Brother   . Fibromyalgia Daughter   . Cystic fibrosis Daughter   . Healthy Daughter   . Fibromyalgia Son    Social History:  reports that she has never smoked. She has never used smokeless tobacco. She reports that she does not drink alcohol or use illicit drugs.  Allergies:  Allergies  Allergen Reactions  .  Codeine Nausea Only  . Levaquin [Levofloxacin In D5w] Nausea And Vomiting and Other (See Comments)    CAUSED BLINDNESS, SKIN SENSITIVE TO TOUCH  . Septra [Sulfamethoxazole-Tmp Ds] Other (See Comments)    unknown  . Sulfa Antibiotics Nausea Only    Medications Prior to Admission  Medication Sig Dispense Refill  . Aclidinium Bromide (TUDORZA PRESSAIR) 400 MCG/ACT AEPB Inhale 1 puff into the lungs 2 (two) times daily.      Marland Kitchen ALPRAZolam (XANAX) 1 MG tablet Take 0.5 mg by mouth 4 (four) times daily. Patient take 1/2 tablet 3-4 times daily      . amLODipine (NORVASC) 5 MG tablet Take 2.5 mg by mouth 2 (two) times daily.       . Calcium Carb-Cholecalciferol (CALCIUM 600 + D) 600-200 MG-UNIT TABS Take 1 tablet by mouth daily.       . Cholecalciferol (VITAMIN D-3) 1000 UNITS CAPS Take by mouth daily.      . ciclesonide (OMNARIS) 50 MCG/ACT nasal spray Place 2 sprays into both nostrils daily.      Marland Kitchen CROMOLYN SODIUM IN Inhale into the lungs 2 (two) times daily. 20 mg/46mL      . Cyanocobalamin (VITAMIN B 12 PO) Take 2,500 mcg by mouth daily.      . cycloSPORINE (RESTASIS) 0.05 % ophthalmic emulsion Place 1 drop into both eyes 2 (two) times daily.      Marland Kitchen denosumab (PROLIA) 60 MG/ML SOLN injection Inject 60 mg into the skin every 6 (six) months. Administer in upper arm, thigh, or abdomen      . esomeprazole (NEXIUM) 40 MG capsule Take 40 mg by mouth daily at 12 noon.      Marland Kitchen levocetirizine (XYZAL) 5 MG tablet Take 5 mg by mouth every evening.      . mirtazapine (REMERON) 15 MG tablet Take 15 mg by mouth at bedtime.      . montelukast (SINGULAIR) 10 MG tablet Take 10 mg by mouth at bedtime.      . Multiple Vitamins-Minerals (MULTI FOR HER PO) Take 1 tablet by mouth daily.       . Omega-3 Fatty Acids (FISH OIL) 1200 MG CAPS Take 2 capsules (2,400 mg total) by mouth daily. Patient states that she takes 5 daily      . ondansetron (ZOFRAN) 4 MG tablet Take 1 tablet (4 mg total) by mouth 3 (three) times daily.   30 tablet  1  . polyethylene glycol (MIRALAX / GLYCOLAX) packet Take 17 g by mouth daily as needed for mild constipation.      . sennosides-docusate sodium (SENOKOT-S) 8.6-50 MG tablet Take 3-6 tablets by mouth at bedtime. Patient states that she takes 3- 6 per night      . sucralfate (CARAFATE) 1 G tablet Take 1 tablet (1 g total) by mouth 4 (four) times daily -  with meals and at bedtime.  120 tablet  1  . bimatoprost (LUMIGAN) 0.03 % ophthalmic solution Place 1 drop into both eyes at bedtime.      Marland Kitchen  diclofenac sodium (VOLTAREN) 1 % GEL Apply 4 g topically as needed (Pain).       . IPRATROPIUM BROMIDE HFA IN Inhale 2 puffs into the lungs 4 (four) times daily. 2 puffs  4 times daily      . traMADol-acetaminophen (ULTRACET) 37.5-325 MG per tablet Take 1-2 tablets by mouth every 6 (six) hours as needed for moderate pain or severe pain.        No results found for this or any previous visit (from the past 48 hour(s)). No results found.  ROS  Blood pressure 137/71, temperature 97.9 F (36.6 C), temperature source Oral, resp. rate 20, SpO2 96.00%. Physical Exam  Constitutional:  Pleasant well-developed thin Caucasian female in NAD  HENT:  Mouth/Throat: Oropharynx is clear and moist.  Eyes: Conjunctivae are normal. No scleral icterus.  Neck: No thyromegaly present.  Cardiovascular: Normal rate, regular rhythm and normal heart sounds.   No murmur heard. Respiratory: Effort normal and breath sounds normal.  GI: Soft. She exhibits no mass. Tenderness: mild tenderness in the epigastric region around laparoscopy scar. There is no rebound and no guarding.  Musculoskeletal: She exhibits no edema.  Lymphadenopathy:    She has no cervical adenopathy.  Neurological: She is alert.  Skin: Skin is warm and dry.     Assessment/Plan Persistent nausea and intermittent epigastric pain. History of papillary stenosis; status post biliary sphincterotomy and stenting about 7 weeks ago. EGD for complete  evaluation of upper GI tract. ERCP with stent removal and cholangiogram.   Linda Adkins 11/18/2013, 8:12 AM

## 2013-11-18 NOTE — Anesthesia Postprocedure Evaluation (Signed)
  Anesthesia Post-op Note  Patient: Linda Adkins  Procedure(s) Performed: Procedure(s): ENDOSCOPIC RETROGRADE CHOLANGIOPANCREATOGRAPHY (ERCP) (N/A) STENT REMOVAL (N/A) BALLOON DILATION (N/A) ESOPHAGOGASTRODUODENOSCOPY (EGD) with biopsy (N/A)  Patient Location: PACU  Anesthesia Type:General  Level of Consciousness: awake and patient cooperative  Airway and Oxygen Therapy: Patient Spontanous Breathing and non-rebreather face mask  Post-op Pain: none  Post-op Assessment: Post-op Vital signs reviewed, Patient's Cardiovascular Status Stable and Respiratory Function Stable  Post-op Vital Signs: Reviewed and stable  Last Vitals:  Filed Vitals:   11/18/13 0951  BP: 144/82  Pulse: 98  Temp: 36.4 C  Resp: 16    Complications: No apparent anesthesia complications

## 2013-11-18 NOTE — Anesthesia Procedure Notes (Signed)
Procedure Name: Intubation Date/Time: 11/18/2013 8:47 AM Performed by: Franco NonesYATES, Suzan Manon S Pre-anesthesia Checklist: Patient identified, Patient being monitored, Timeout performed, Emergency Drugs available and Suction available Patient Re-evaluated:Patient Re-evaluated prior to inductionOxygen Delivery Method: Circle System Utilized Preoxygenation: Pre-oxygenation with 100% oxygen Intubation Type: IV induction and Cricoid Pressure applied Ventilation: Mask ventilation without difficulty Laryngoscope Size: Miller and 2 Grade View: Grade I Tube type: Oral Tube size: 7.0 mm Number of attempts: 1 Airway Equipment and Method: stylet Placement Confirmation: ETT inserted through vocal cords under direct vision,  positive ETCO2 and breath sounds checked- equal and bilateral Secured at: 20 cm Tube secured with: Tape Dental Injury: Teeth and Oropharynx as per pre-operative assessment

## 2013-11-18 NOTE — Anesthesia Preprocedure Evaluation (Addendum)
Anesthesia Evaluation  Patient identified by MRN, date of birth, ID band Patient awake    Reviewed: Allergy & Precautions, H&P , NPO status , Patient's Chart, lab work & pertinent test results  Airway Mallampati: II TM Distance: >3 FB Neck ROM: Full    Dental  (+) Teeth Intact   Pulmonary sleep apnea and Oxygen sleep apnea , COPD COPD inhaler,  breath sounds clear to auscultation        Cardiovascular hypertension, Pt. on medications Rhythm:Regular Rate:Normal     Neuro/Psych PSYCHIATRIC DISORDERS Depression    GI/Hepatic GERD-  Medicated and Controlled,  Endo/Other    Renal/GU      Musculoskeletal   Abdominal   Peds  Hematology  (+) anemia ,   Anesthesia Other Findings   Reproductive/Obstetrics                           Anesthesia Physical Anesthesia Plan  ASA: III  Anesthesia Plan: General   Post-op Pain Management:    Induction: Intravenous, Rapid sequence and Cricoid pressure planned  Airway Management Planned: Oral ETT  Additional Equipment:   Intra-op Plan:   Post-operative Plan: Extubation in OR  Informed Consent: I have reviewed the patients History and Physical, chart, labs and discussed the procedure including the risks, benefits and alternatives for the proposed anesthesia with the patient or authorized representative who has indicated his/her understanding and acceptance.     Plan Discussed with:   Anesthesia Plan Comments: (amidate induction)        Anesthesia Quick Evaluation

## 2013-11-18 NOTE — Op Note (Signed)
EGD  AND ERCP PROCEDURE REPORT  PATIENT:  Linda Adkins  MR#:  [id number] Birthdate:  06/24/1929, 80 y.o., female Endoscopist:  Dr. Malissa HippoNajeeb U. Chevette Fee, MD Referred By:  Dr. Halina MaidensJack Spainhour, MD Procedure Date: 11/18/2013  Procedure:   EGD and ERCP.  Indications:  Patient is an 80 year old Caucasian female who underwent ERCP with sphincterotomy and biliary and pancreatic stenting on 09/11/2013. She is on returning for stent removal. She continues to complain of nausea anorexia and intermittent epigastric pain. She is concerned that she has peptic ulcer disease. She is therefore undergoing EGD prior to ERCP.            Informed Consent:  The risks, benefits, alternatives & imponderables which include, but are not limited to, bleeding, infection, perforation, drug reaction and potential missed lesion have been reviewed.  The potential for biopsy, lesion removal, esophageal dilation, etc. have also been discussed.  Questions have been answered.  All parties agreeable.  Please see history & physical in medical record for more information.  Medications:  Gen. endotracheal anesthesia. Please see anesthesia records for details.   Description of procedure:  The endoscope was introduced through the mouth and advanced to the second portion of the duodenum without difficulty or limitations. The mucosal surfaces were surveyed very carefully during advancement of the scope and upon withdrawal.  Findings:  Esophagus:  Mucosa of the esophagus was normal. The GE junction was unremarkable. Stomach:  Moderate amount of bile noted in the stomach. It distended very well with insufflation. Mucosa at gastric body was normal. Antral mucosa revealed patchy erythema edema and fine nodularity. Therefore multiple biopsies taken for routine histology. Pyloric channel was patent. Angularis fundus and cardia was unremarkable. Duodenum:  Bulbar mucosa was normal. Post bulbar folds were somewhat atrophic. The stent was in  place.  Therapeutic/Diagnostic Maneuvers Performed:   Multiple biopsies taken from antral mucosa for routine histology as above. Biliary stent was removed using polypectomy snare under fluoroscopic control.    ERCP;  Description of procedure:   Once EGD was completed, therapeutic Pentax reviewed using the scope was advanced via oropharynx esophagus stomach and across the pylorus into the descending duodenum.   Findings:  Normal appearing ampulla of Vater. CBD was cannulated with Rx 44  autotome and 035 hyrajag wire and filled with dilute contrast. CBD and CHD were markedly dilated. CHD was larger than CBD. Intrahepatic biliary radicles were normal. Dormia basket was told to bile duct three times. 9 mm balloon was passed across ampulla. Pancreatic duct was not filled with contrast for cannulated.  Therapeutic/Diagnostic Maneuvers Performed:  None  Complications:  None  Impression:   EGD findings; Antral gastritis with nodularity. Biopsy taken for routine histology. Biliary stent removed with polypectomy snare under fluoroscopic control.  ERCP findings; Markedly dilated CBD and CHD. Normal intrahepatic biliary radicles. Sphincterotomy felt to be adequate with spontaneous drainage of contrast. No filling defects identified.  Recommendations:  Standard instructions given. Will obtain KUB prior to discharge. I will be contacting patient results of biopsy and further recommendations.   Malissa Hippoajeeb U Zi Newbury  11/18/2013  9:49 AM  CC: Dr. Aggie CosierBALAJI DESAI, MD & Dr. Bonnetta BarryNo ref. provider found CC Dr. Halina MaidensJack Spainhour, MD    Joline MaxcyNajeeb U Grayton Lobo  11/18/2013  9:44 AM  CC: Dr. Aggie CosierBALAJI DESAI, MD & Dr. Bonnetta BarryNo ref. provider found

## 2013-11-20 ENCOUNTER — Encounter (HOSPITAL_COMMUNITY): Payer: Self-pay | Admitting: Internal Medicine

## 2013-11-23 ENCOUNTER — Other Ambulatory Visit (INDEPENDENT_AMBULATORY_CARE_PROVIDER_SITE_OTHER): Payer: Self-pay | Admitting: Internal Medicine

## 2013-11-24 ENCOUNTER — Encounter (INDEPENDENT_AMBULATORY_CARE_PROVIDER_SITE_OTHER): Payer: Self-pay | Admitting: *Deleted

## 2014-01-23 ENCOUNTER — Other Ambulatory Visit (INDEPENDENT_AMBULATORY_CARE_PROVIDER_SITE_OTHER): Payer: Self-pay | Admitting: Internal Medicine

## 2014-01-26 ENCOUNTER — Other Ambulatory Visit (INDEPENDENT_AMBULATORY_CARE_PROVIDER_SITE_OTHER): Payer: Self-pay | Admitting: Internal Medicine

## 2014-01-26 MED ORDER — ONDANSETRON HCL 4 MG PO TABS: ORAL_TABLET | ORAL | Status: AC

## 2014-06-30 ENCOUNTER — Encounter (INDEPENDENT_AMBULATORY_CARE_PROVIDER_SITE_OTHER): Payer: Self-pay

## 2016-03-02 IMAGING — RF DG ERCP WO/W SPHINCTEROTOMY
1 series · 10 of 10 positions shown · non-contrast
Comparison: None.

CLINICAL DATA: Dilated common bile duct stone with gallstones.

EXAM:
ERCP
TECHNIQUE: Multiple spot images obtained with the fluoroscopic device and
submitted for interpretation post-procedure.

[Series 1: run · 7 acquisitions, 10 frames shown]
[im 1/7]
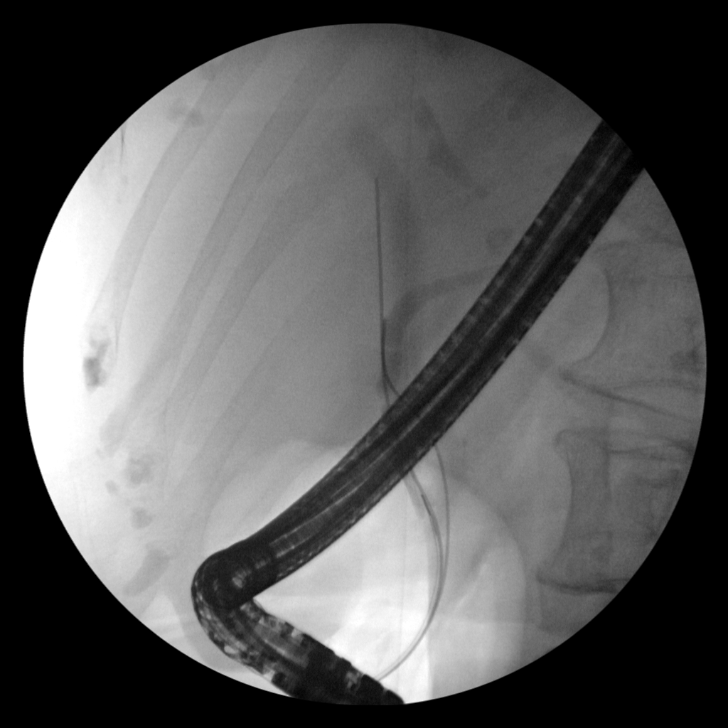
[im 1/7]
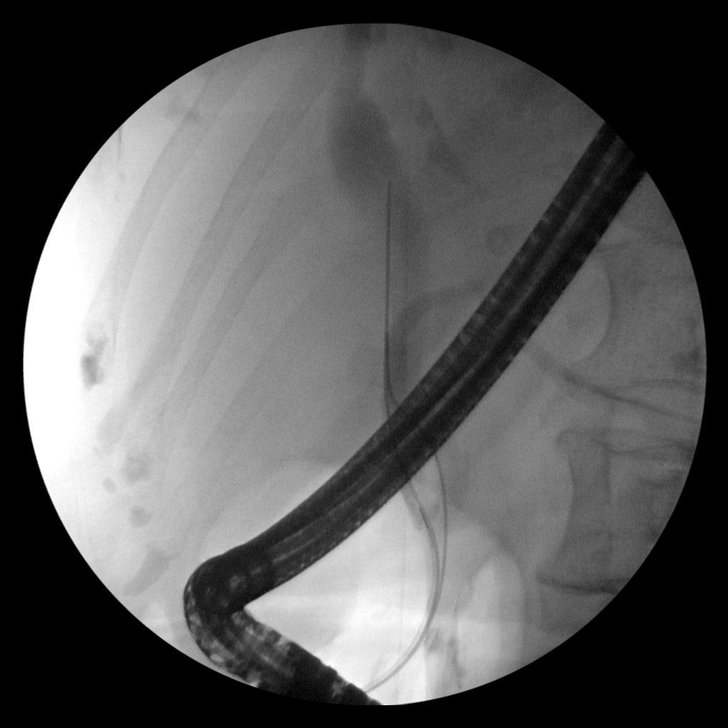
[im 1/7]
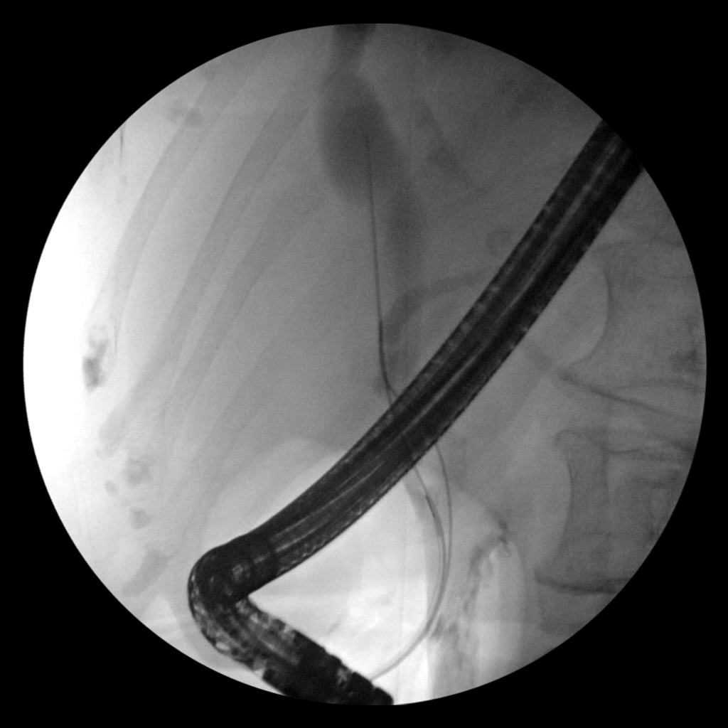
[im 1/7]
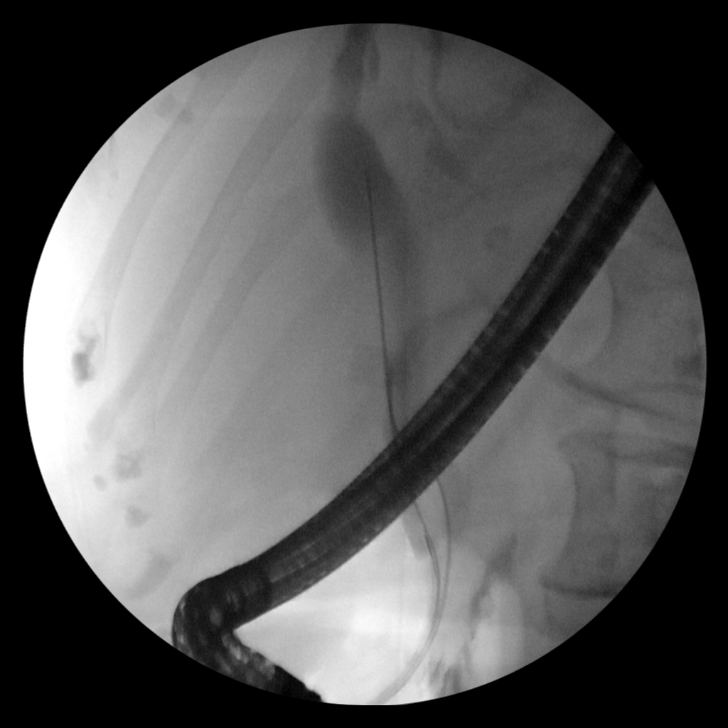
[im 2/7]
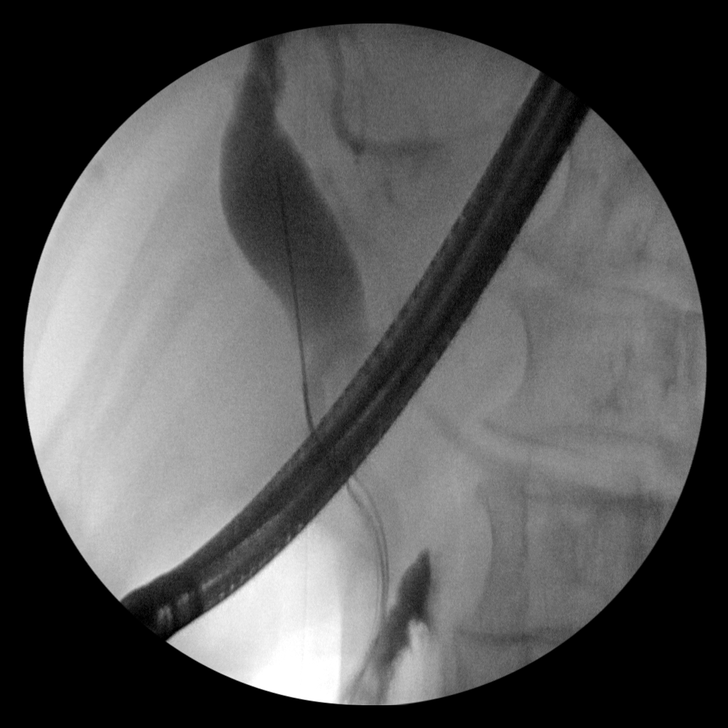
[im 3/7]
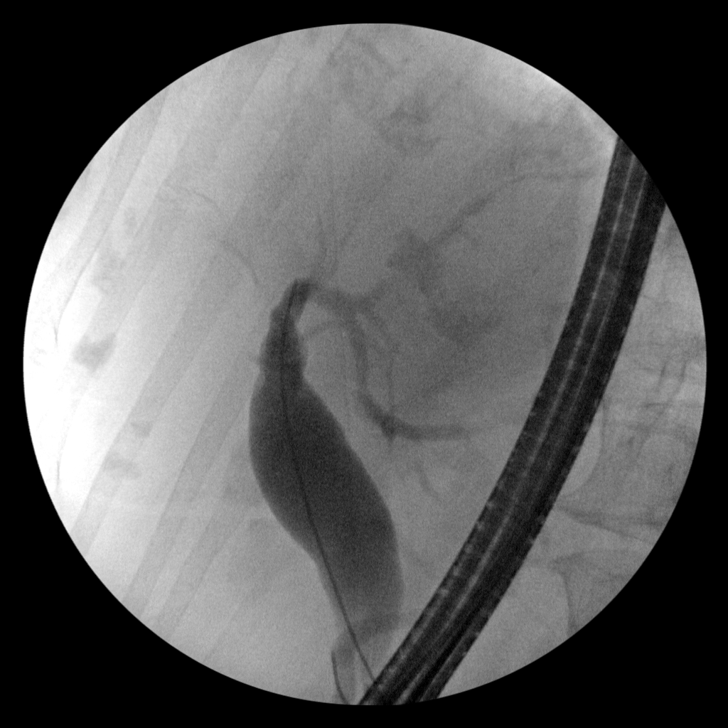
[im 4/7]
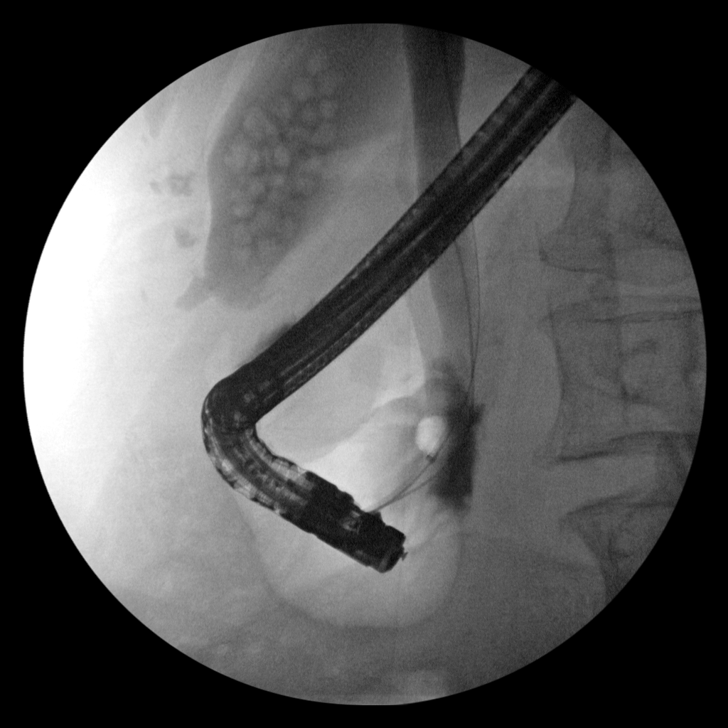
[im 5/7]
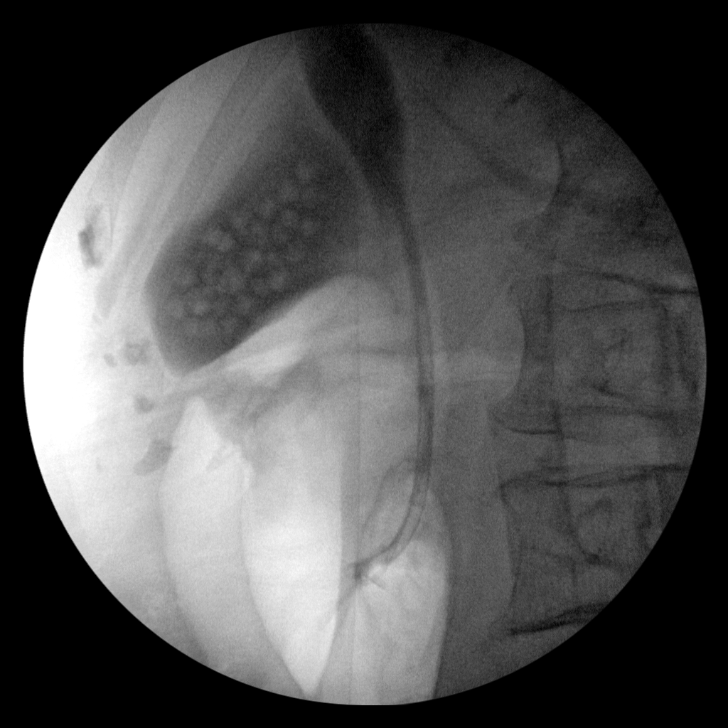
[im 6/7]
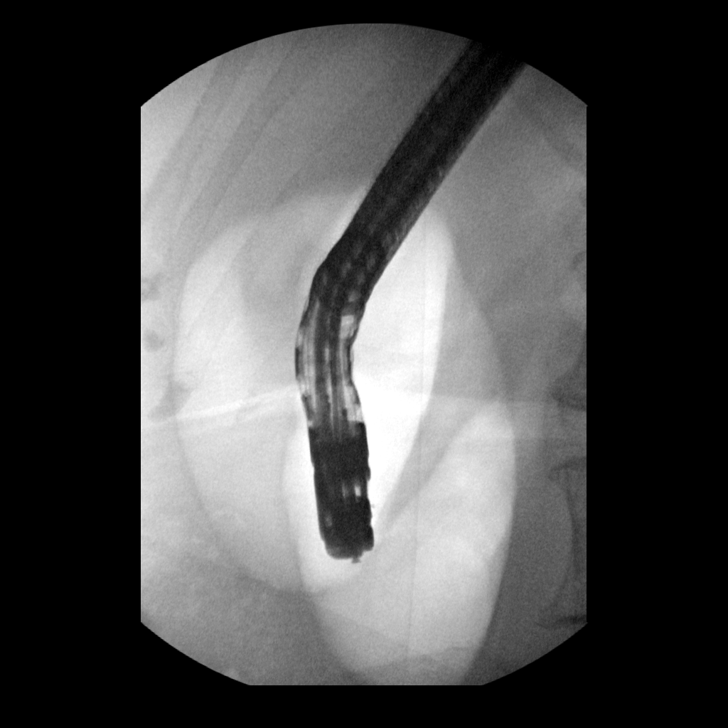
[im 7/7]
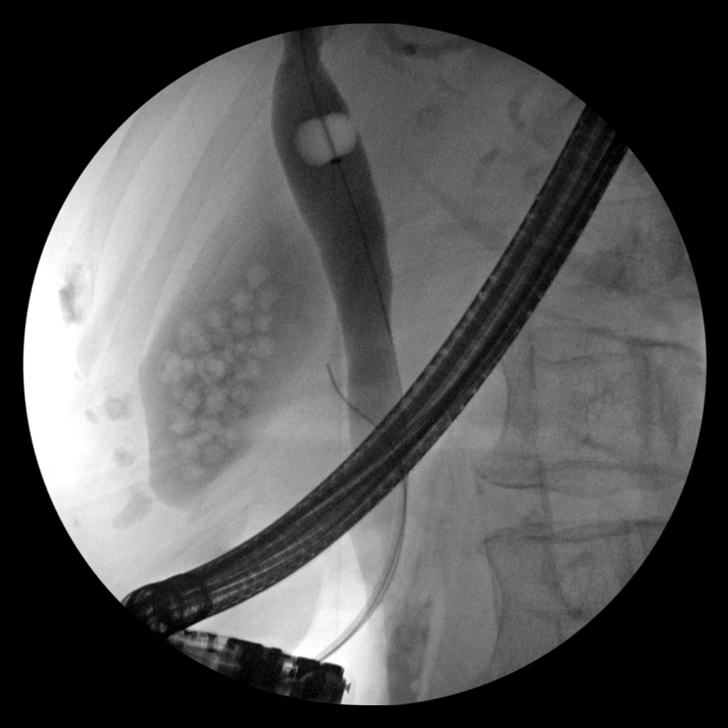

[10 of 10 positions shown; findings below may reference images not displayed]

FINDINGS: Multiple spot fluoroscopic images are submitted. These were reviewed
with Dr. Alharbi following the procedure. The common bile duct is
moderately dilated. The distal duct is not well visualized, although
no definite filling defects or ulceration identified. Multiple
gallstones are noted within the gallbladder lumen. Balloon
pull-through was performed, not yielding any common duct stones. A
plastic biliary stent was placed with adequate decompression of the
biliary system. A small amount of contrast is noted within the
pancreatic duct.
IMPRESSION: Cholelithiasis with biliary dilatation of undetermined etiology. No
common duct stones identified radiographically or by endoscopy.
Biliary stent placement.

These images were submitted for radiologic interpretation only.
Please see the procedural report for the amount of contrast and the
fluoroscopy time utilized.

## 2016-04-19 IMAGING — CT CT ABD-PELV W/ CM
2 of 4 series · 15 of 46 positions shown, 17 images · IV contrast (omnipaque)
Comparison: No priors.

CLINICAL DATA: Nausea, anorexia and weight loss. Upper abdominal
pain.

EXAM:
CT ABDOMEN AND PELVIS WITH CONTRAST
TECHNIQUE: Multidetector CT imaging of the abdomen and pelvis was performed
using the standard protocol following bolus administration of
intravenous contrast.
CONTRAST:  100mL OMNIPAQUE IOHEXOL 300 MG/ML  SOLN

[Series 2: abd_pel_with 5.0 b40f · axial · 0.59mm/px · z∈[-688,-288]mm · 12 of 90 slices shown, 14 images]
[im 5/90  soft-tissue]
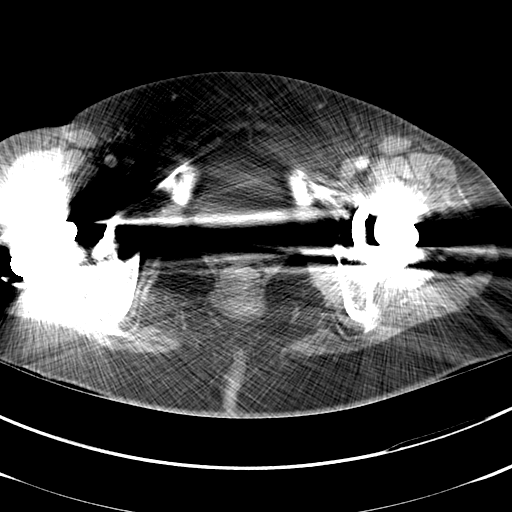
[im 5/90  bone]
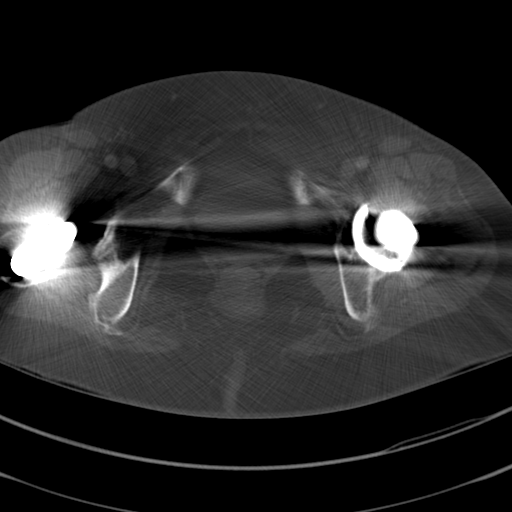
[im 13/90  soft-tissue]
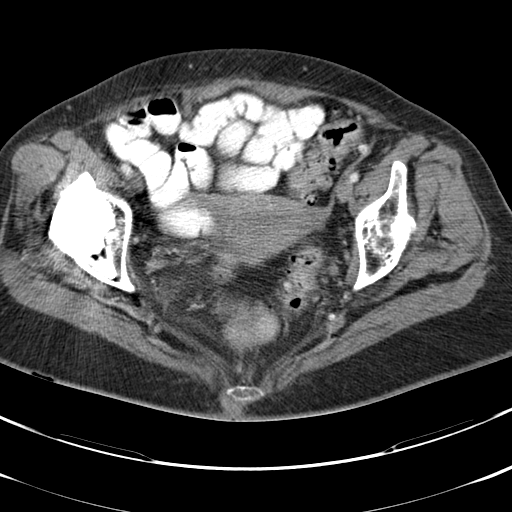
[im 22/90  soft-tissue]
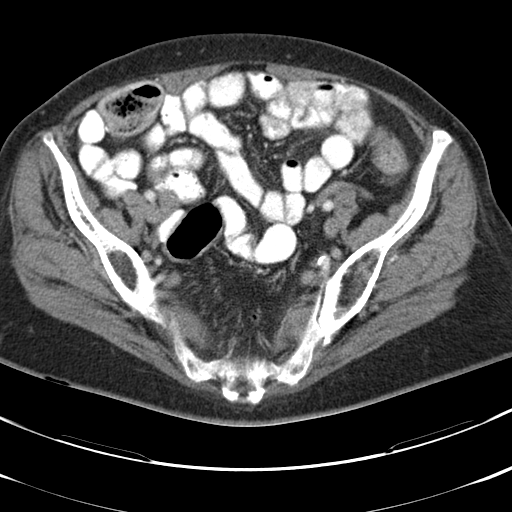
[im 26/90  soft-tissue]
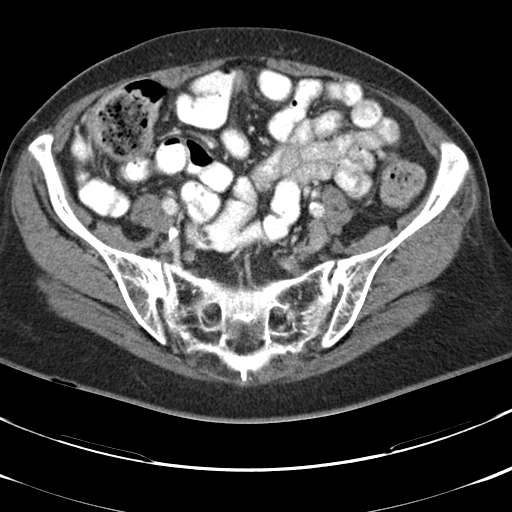
[im 34/90  soft-tissue]
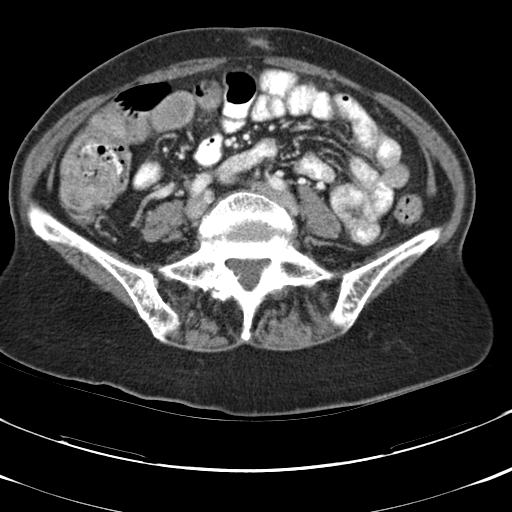
[im 43/90  soft-tissue]
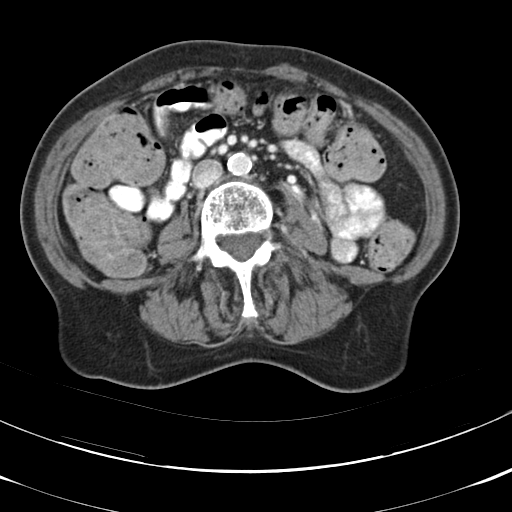
[im 47/90  soft-tissue]
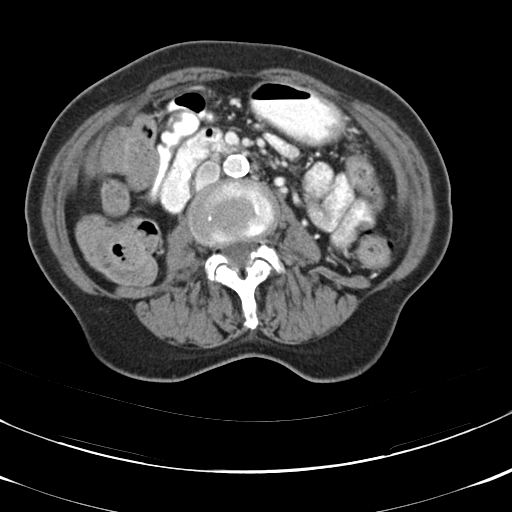
[im 56/90  soft-tissue]
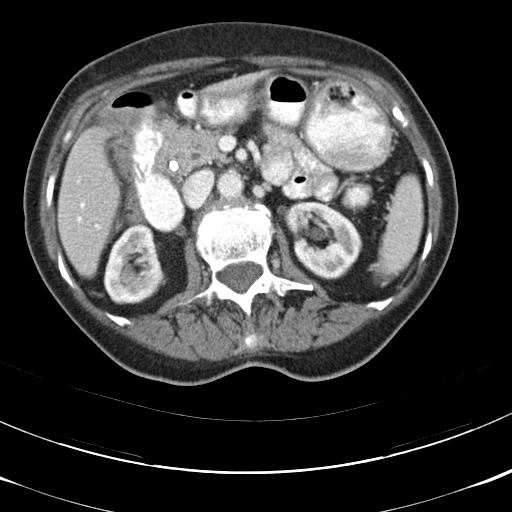
[im 64/90  soft-tissue]
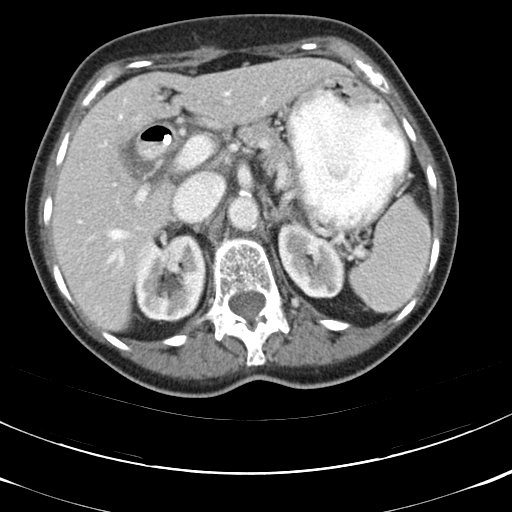
[im 64/90  bone]
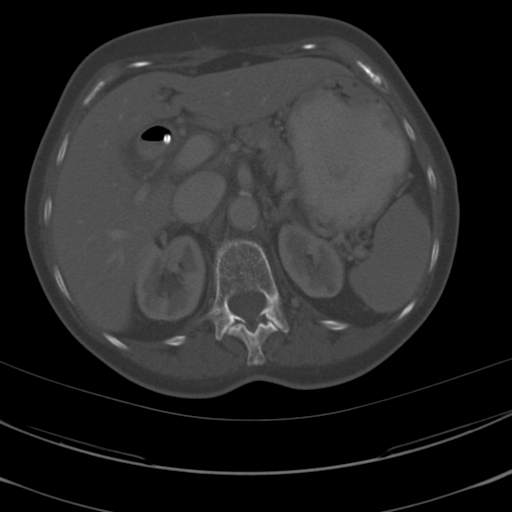
[im 68/90  soft-tissue]
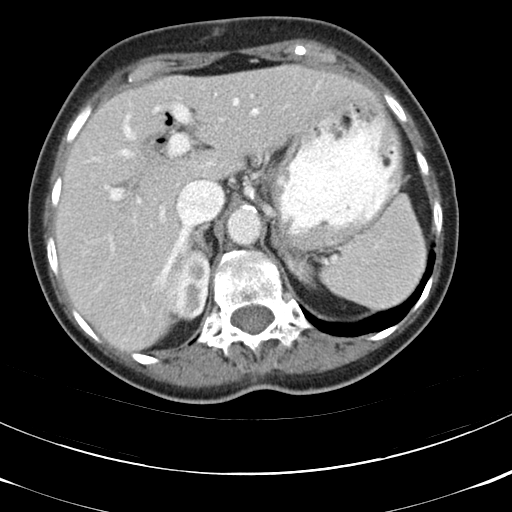
[im 77/90  soft-tissue]
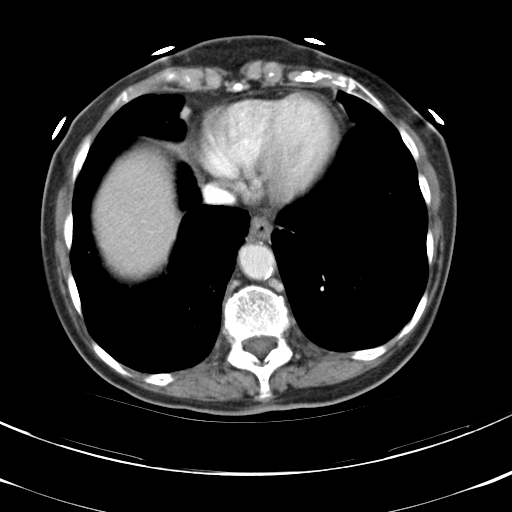
[im 85/90  soft-tissue]
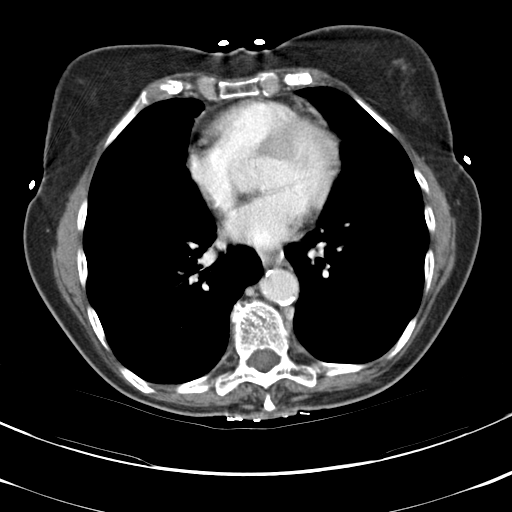

[Series 4: abd_pel_with 3.0 spo cor · coronal · 0.65mm/px · 3 of 75 slices shown]
[im 25/75  soft-tissue]
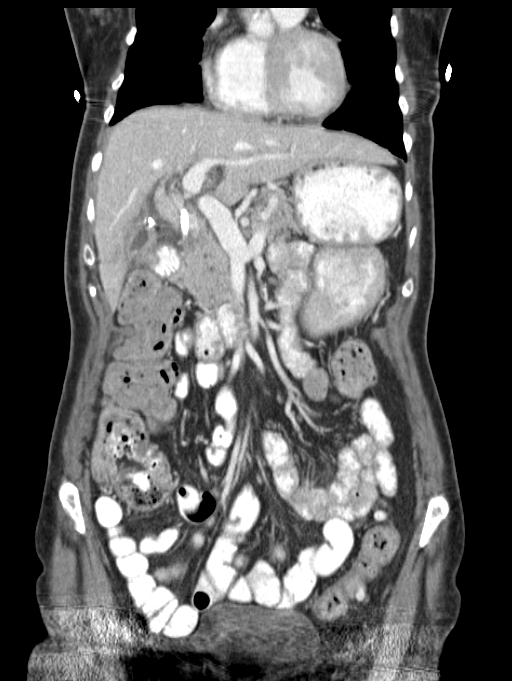
[im 33/75  soft-tissue]
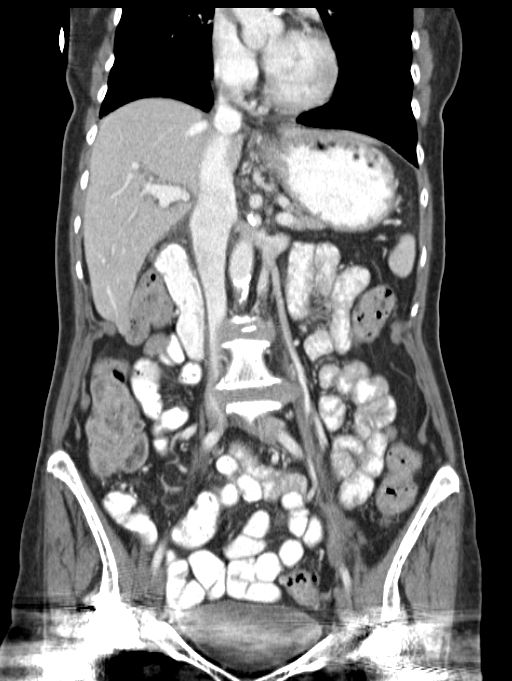
[im 42/75  soft-tissue]
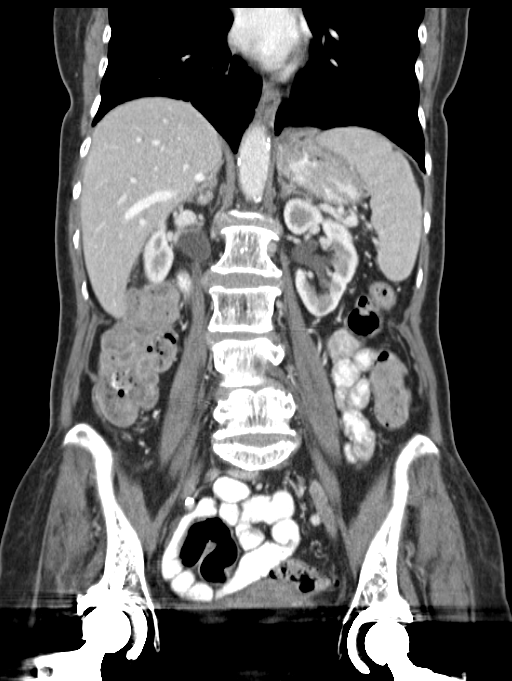

[15 of 46 positions shown; findings below may reference images not displayed]

FINDINGS: Lung Bases: Linear scarring in the medial segment of the right
middle lobe. Atherosclerotic calcifications in the left main
coronary artery and proximal right coronary artery.

Abdomen/Pelvis: Status post cholecystectomy. In the gallbladder
fossa there is a small low-attenuation fluid collection with thick
rim of enhancement measuring 1.2 x 1.3 cm (image 31 of series 2).
Faint peripheral calcifications noted along the lateral margin of
this. This is immediately adjacent to the surgical clips. There is a
biliary stent in place extending from the common hepatic duct
through the ampulla into the third portion of the duodenum. A small
amount of pneumobilia is noted. Minimal intrahepatic biliary ductal
dilatation is also noted.

The appearance of the pancreas, spleen, bilateral adrenal glands and
left kidney is unremarkable. Extra renal pelvis in the right kidney
(normal anatomical variant) incidentally noted. Throughout the
abdominal and pelvic vasculature, without evidence of aneurysm.
Numerous colonic diverticulae are noted, without surrounding
inflammatory changes to suggest an acute diverticulitis at this
time. No significant volume of ascites. No pneumoperitoneum. No
pathologic distention of small bowel. No definite lymphadenopathy
identified within the abdomen or pelvis. Portions of the pelvis are
uninterpretable secondary to extensive beam hardening artifact from
the patient's bilateral total hip arthroplasties. In particular, the
uterus, adnexal regions and much of the urinary bladder are
completely obscured.

Musculoskeletal: There are no aggressive appearing lytic or blastic
lesions noted in the visualized portions of the skeleton.
IMPRESSION: 1. Postoperative changes of recent cholecystectomy with small
postoperative fluid collection in the gallbladder fossa. This is
x 1.3 cm with a thick rim of enhancement and some faint peripheral
calcifications. This may simply represent a resolving postoperative
seroma or postoperative hematoma, however, the possibility of
infection is difficult to exclude. Clinical correlation is
recommended.
2. Biliary stent in position, with small volume of pneumobilia and
very mild intrahepatic biliary ductal dilatation.
3. Extensive colonic diverticulosis without findings to suggest
acute diverticulitis at this time.
4. Extensive atherosclerosis, including multivessel coronary artery
disease.

## 2016-05-09 IMAGING — RF DG ERCP WO/W SPHINCTEROTOMY
1 series · 15 of 17 positions shown · non-contrast
Comparison: CT, 10/29/2013

CLINICAL DATA: ERCP with stent removal.

EXAM:
ERCP
TECHNIQUE: Multiple spot images obtained with the fluoroscopic device and
submitted for interpretation post-procedure.

[Series 1: run · 9 acquisitions, 15 frames shown]
[im 1/9]
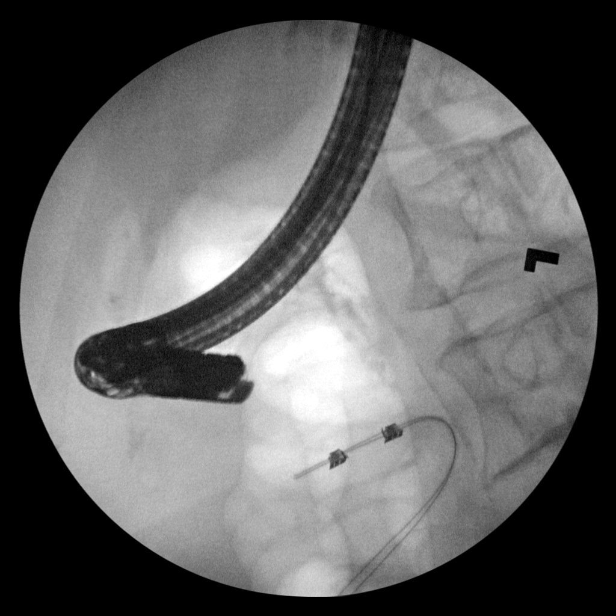
[im 2/9]
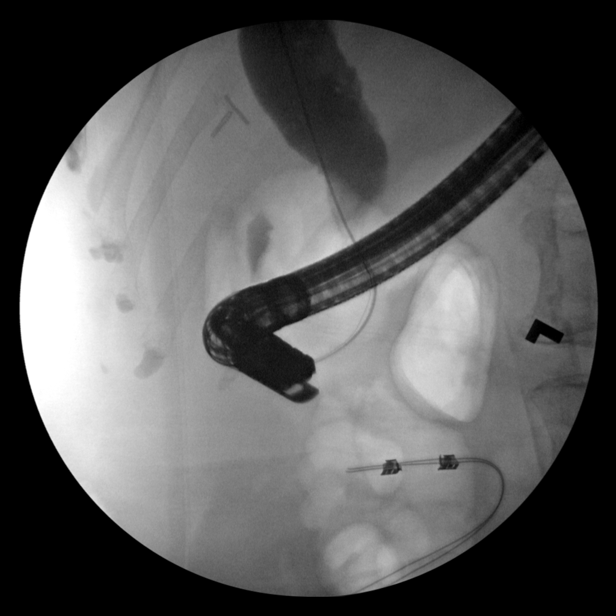
[im 2/9]
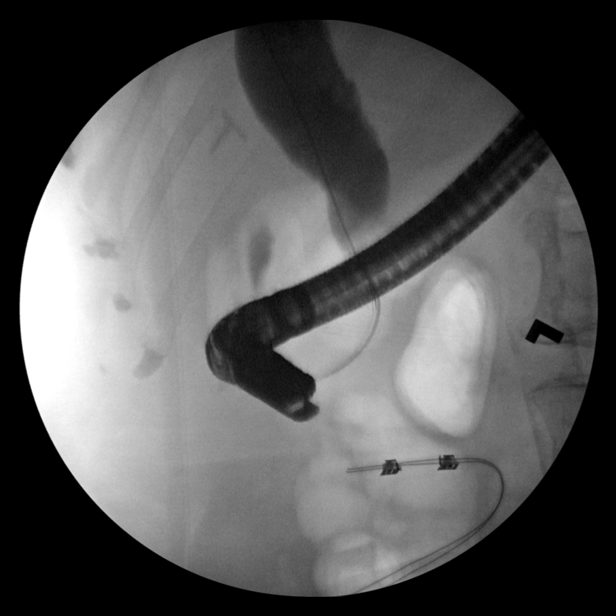
[im 2/9]
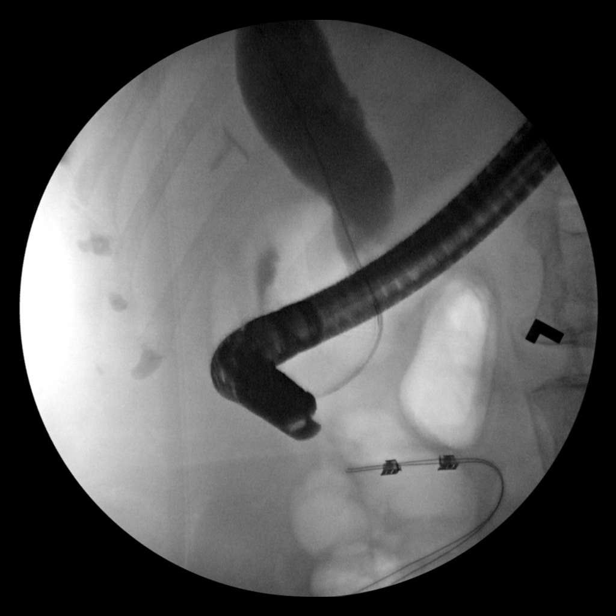
[im 3/9]
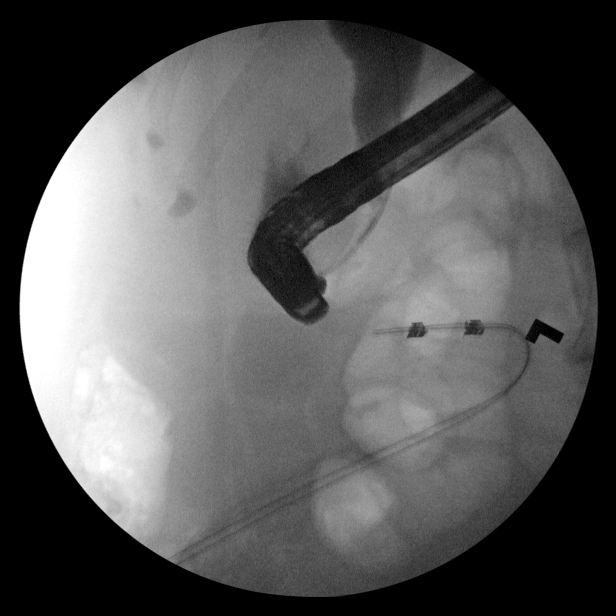
[im 4/9]
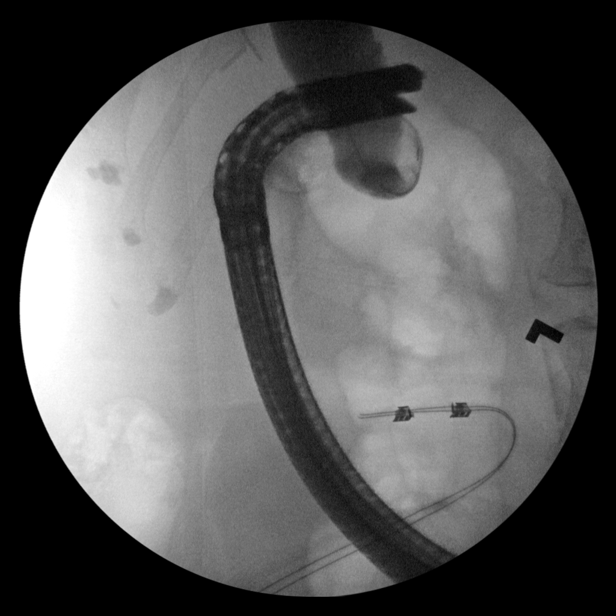
[im 5/9]
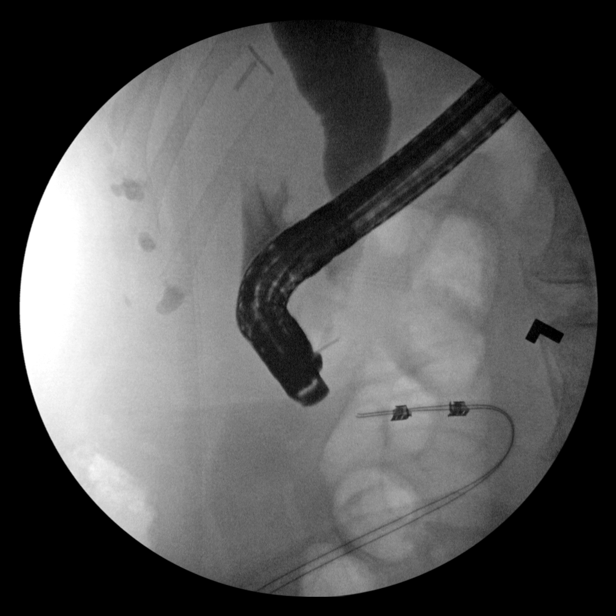
[im 6/9]
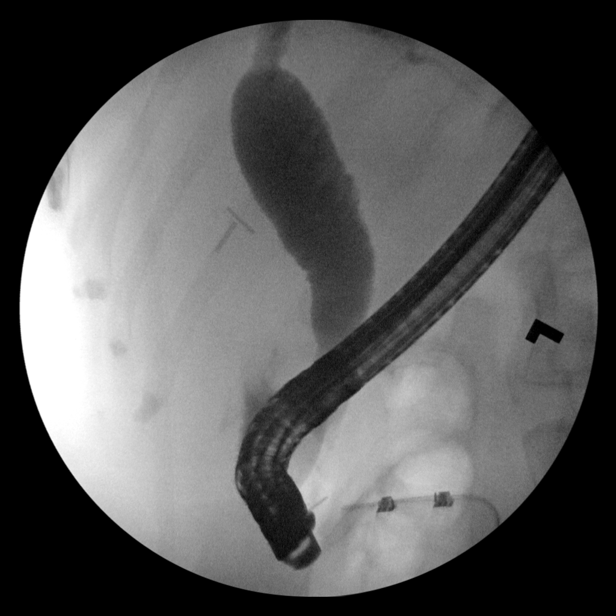
[im 7/9]
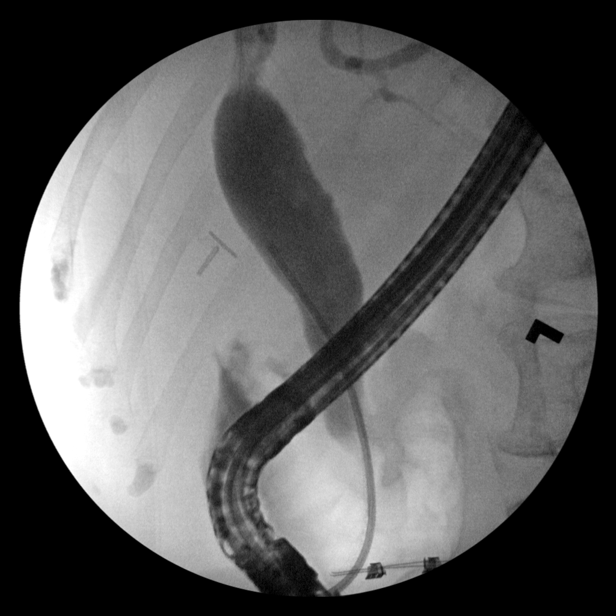
[im 8/9]
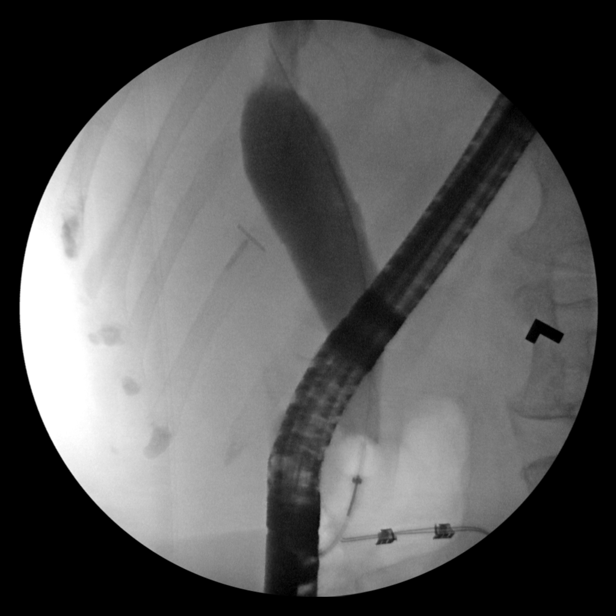
[im 8/9]
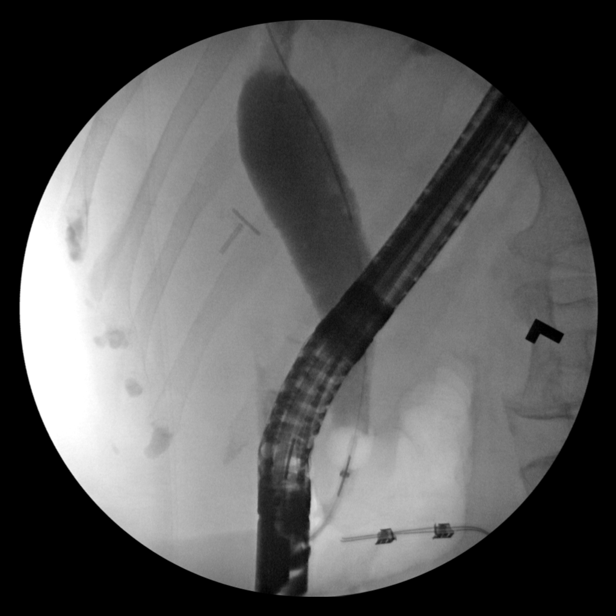
[im 8/9]
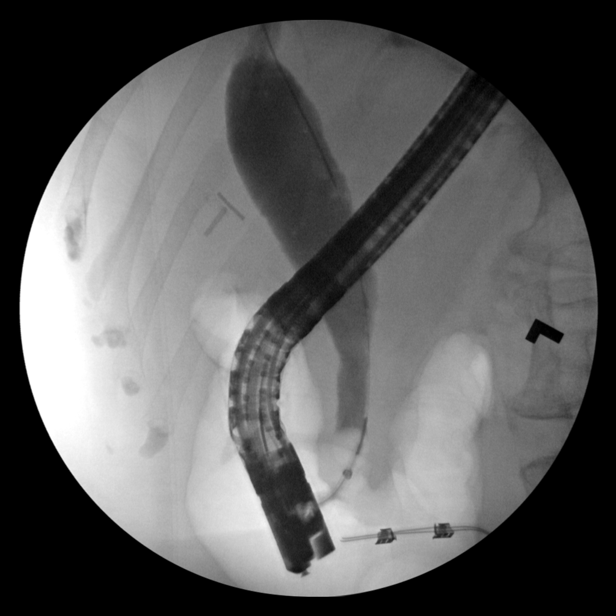
[im 9/9]
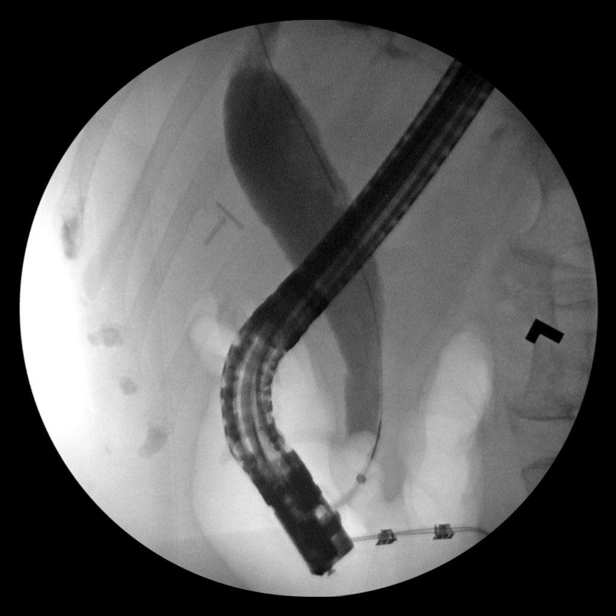
[im 9/9]
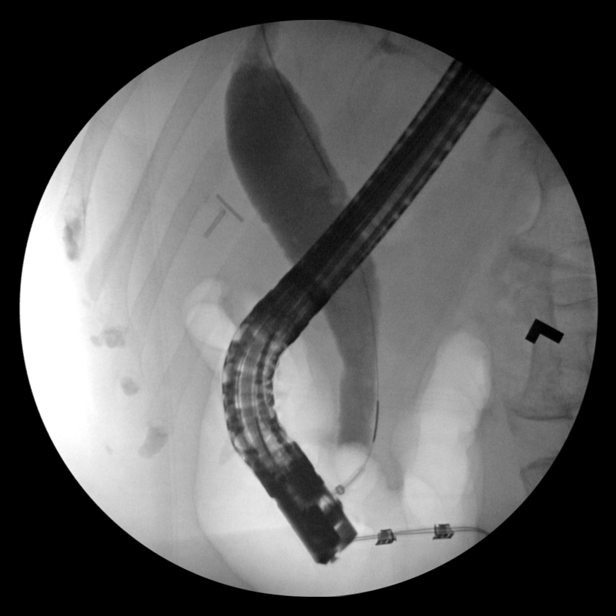
[im 9/9]
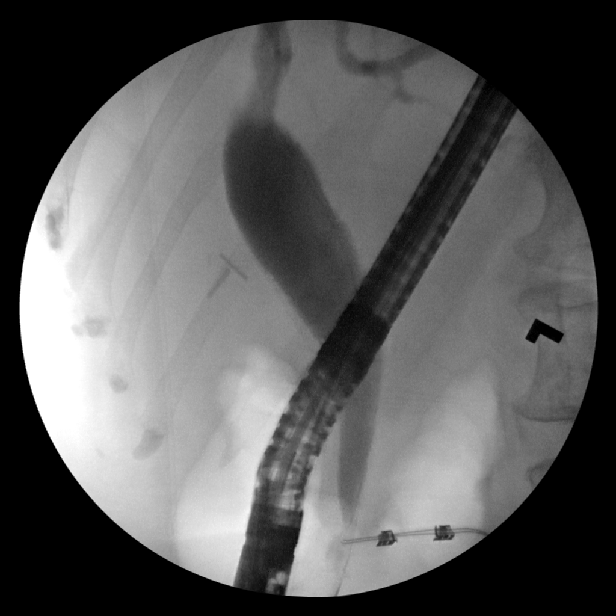

[15 of 17 positions shown; findings below may reference images not displayed]

FINDINGS: There history subtle dilation of the common bile duct. No filling
defect is seen to suggest a duct stone. There is narrowing of the
duct distally in the region of the common hepatic duct.

There is no contrast extravasation to suggest a duct leak
IMPRESSION: ERCP. No duct stone is seen. Fusiform dilation of the common bile
duct. Please refer to the procedure report for a complete
description.

These images were submitted for radiologic interpretation only.
Please see the procedural report for the amount of contrast and the
fluoroscopy time utilized.

## 2019-07-20 ENCOUNTER — Encounter (INDEPENDENT_AMBULATORY_CARE_PROVIDER_SITE_OTHER): Payer: Self-pay | Admitting: Gastroenterology

## 2019-08-13 ENCOUNTER — Other Ambulatory Visit: Payer: Self-pay

## 2019-08-13 ENCOUNTER — Ambulatory Visit (INDEPENDENT_AMBULATORY_CARE_PROVIDER_SITE_OTHER): Payer: Medicare Other | Admitting: Gastroenterology

## 2019-08-13 ENCOUNTER — Other Ambulatory Visit: Payer: Self-pay | Admitting: Occupational Medicine

## 2019-08-13 ENCOUNTER — Encounter (INDEPENDENT_AMBULATORY_CARE_PROVIDER_SITE_OTHER): Payer: Self-pay | Admitting: Gastroenterology

## 2019-08-13 VITALS — BP 132/77 | HR 101 | Temp 98.0°F | Ht 65.0 in | Wt 133.0 lb

## 2019-08-13 DIAGNOSIS — D509 Iron deficiency anemia, unspecified: Secondary | ICD-10-CM

## 2019-08-13 DIAGNOSIS — R1013 Epigastric pain: Secondary | ICD-10-CM

## 2019-08-13 DIAGNOSIS — K625 Hemorrhage of anus and rectum: Secondary | ICD-10-CM | POA: Diagnosis not present

## 2019-08-14 LAB — CBC WITH DIFFERENTIAL/PLATELET
Absolute Monocytes: 547 cells/uL (ref 200–950)
Basophils Absolute: 72 cells/uL (ref 0–200)
Basophils Relative: 1 %
Eosinophils Absolute: 490 cells/uL (ref 15–500)
Eosinophils Relative: 6.8 %
HCT: 35 % (ref 35.0–45.0)
Hemoglobin: 11.4 g/dL — ABNORMAL LOW (ref 11.7–15.5)
Lymphs Abs: 1634 cells/uL (ref 850–3900)
MCH: 30.9 pg (ref 27.0–33.0)
MCHC: 32.6 g/dL (ref 32.0–36.0)
MCV: 94.9 fL (ref 80.0–100.0)
MPV: 8.7 fL (ref 7.5–12.5)
Monocytes Relative: 7.6 %
Neutro Abs: 4457 cells/uL (ref 1500–7800)
Neutrophils Relative %: 61.9 %
Platelets: 214 10*3/uL (ref 140–400)
RBC: 3.69 10*6/uL — ABNORMAL LOW (ref 3.80–5.10)
RDW: 12.7 % (ref 11.0–15.0)
Total Lymphocyte: 22.7 %
WBC: 7.2 10*3/uL (ref 3.8–10.8)

## 2019-08-14 LAB — IRON: Iron: 65 ug/dL (ref 45–160)

## 2019-08-14 LAB — FERRITIN: Ferritin: 79 ng/mL (ref 16–288)

## 2019-08-14 MED ORDER — PANTOPRAZOLE SODIUM 40 MG PO TBEC: 40.0000 mg | DELAYED_RELEASE_TABLET | Freq: Every day | ORAL | 6 refills | Status: AC

## 2019-08-14 NOTE — Progress Notes (Signed)
Patient profile: Linda Adkins is a 86 y.o. female seen for evaluation of rectal bleeding. Referred by Dr. Celine Mans for evaluation of rectal bleeding.   History of Present Illness: Linda Adkins is seen today for evaluation of rectal bleeding. She first noticed the rectal bleeding around June 14, 2019.  And it lasted for a couple weeks and she had loose stools 2-3 times a day when was having the bleeding.  She did not have any rectal pain with the bleeding.  Describes bleeding as a small amount of bright red blood in stool.  The bleeding resolved spontaneously and she has not seen the bleeding in at least 6 weeks at this point.  She is now using MiraLAX once a day with BM 1-2x times a day that are soft without straining, this is her chronic baseline bowel habits.   She does report some mild upper abdominal pain that actually improves with eating.  Also feels queasy and nausea improves with eating.  She is not having any vomiting or GERD symptoms, o longer taking nexium on her med list.  She does not have any dysphagia.  Upper abdominal pain ongoing past several weeks.  She was using Aleve daily until about a week ago.  Takes an iron supplement every other day chronically.  Feels her appetite is good her weight is stable.  Her daughter accompanies and helps with history.  Wt Readings from Last 3 Encounters:  08/13/19 133 lb (60.3 kg)  11/10/13 119 lb 12.8 oz (54.3 kg)  10/26/13 119 lb 3.2 oz (54.1 kg)     Last Colonoscopy: Unable to find in epic.  Per patient approximately 10 years ago    Past Medical History:  Past Medical History:  Diagnosis Date  . Anemia   . Arthritis   . Celiac disease   . Cholelithiasis 09/12/2013  . Chronic anxiety 09/12/2013  . Common bile duct dilation 09/12/2013  . COPD (chronic obstructive pulmonary disease) (HCC)   . Depression   . Dexamethasone adverse reaction   . GERD (gastroesophageal reflux disease)   . Glaucoma   . Hyperlipemia   . Hypertension   .  Pancreatitis, acute   . Seasonal allergies   . Sleep apnea    o2 at night. No CPAP.    Problem List: Patient Active Problem List   Diagnosis Date Noted  . Cholelithiasis 09/12/2013  . S/P ERCP 09/12/2013  . Common bile duct dilation 09/12/2013  . Chronic anxiety 09/12/2013  . Pancreatitis, acute 09/11/2013  . GERD (gastroesophageal reflux disease) 09/10/2013  . Celiac disease 09/10/2013  . Osteoporosis 09/10/2013  . Depression 09/10/2013  . Hypertension 09/10/2013  . Seasonal allergies 09/10/2013  . Personal history of colonic polyps 09/10/2013  . Hyperlipidemia 09/10/2013    Past Surgical History: Past Surgical History:  Procedure Laterality Date  . APPENDECTOMY    . BALLOON DILATION N/A 09/11/2013   Procedure: BALLOON DILATION;  Surgeon: Malissa Hippo, MD;  Location: [facility];  Service: Endoscopy;  Laterality: N/A;  . BALLOON DILATION N/A 11/18/2013   Procedure: BALLOON DILATION;  Surgeon: Malissa Hippo, MD;  Location: [facility];  Service: Endoscopy;  Laterality: N/A;  . BILIARY STENT PLACEMENT N/A 09/11/2013   Procedure: BILIARY STENT PLACEMENT;  Surgeon: Malissa Hippo, MD;  Location: [facility];  Service: Endoscopy;  Laterality: N/A;  . CATARACT EXTRACTION, BILATERAL    . CHOLECYSTECTOMY N/A 09/28/2013   Procedure: LAPAROSCOPIC CHOLECYSTECTOMY;  Surgeon: Dalia Heading, MD;  Location: [facility];  Service: General;  Laterality: N/A;  . COLONOSCOPY  12/29/2010  . ERCP N/A 09/11/2013   Procedure: ENDOSCOPIC RETROGRADE CHOLANGIOPANCREATOGRAPHY (ERCP);  Surgeon: Malissa Hippo, MD;  Location: [facility];  Service: Endoscopy;  Laterality: N/A;  . ERCP N/A 11/18/2013   Procedure: ENDOSCOPIC RETROGRADE CHOLANGIOPANCREATOGRAPHY (ERCP);  Surgeon: Malissa Hippo, MD;  Location: [facility];  Service: Endoscopy;  Laterality: N/A;  . ERCP W/ SPHICTEROTOMY  09/11/13   And stent placement  . ESOPHAGOGASTRODUODENOSCOPY N/A 11/18/2013   Procedure: ESOPHAGOGASTRODUODENOSCOPY (EGD) with biopsy;  Surgeon: Malissa Hippo, MD;  Location: [facility];  Service: Endoscopy;  Laterality: N/A;  . HERNIA REPAIR     umbilical  . JOINT REPLACEMENT    . PANCREATIC STENT PLACEMENT N/A 09/11/2013   Procedure: PANCREATIC STENT PLACEMENT;  Surgeon: Malissa Hippo, MD;  Location: [facility];  Service: Endoscopy;  Laterality: N/A;  . SPHINCTEROTOMY N/A 09/11/2013   Procedure: SPHINCTEROTOMY;  Surgeon: Malissa Hippo, MD;  Location: [facility];  Service: Endoscopy;  Laterality: N/A;  . SPYGLASS CHOLANGIOSCOPY N/A 09/11/2013   Procedure: SPYGLASS CHOLANGIOSCOPY;  Surgeon: Malissa Hippo, MD;  Location: [facility];  Service: Endoscopy;  Laterality: N/A;  . STENT REMOVAL N/A 11/18/2013   Procedure: STENT REMOVAL;  Surgeon: Malissa Hippo, MD;  Location: [facility];  Service: Endoscopy;  Laterality: N/A;  . TOTAL HIP ARTHROPLASTY Bilateral 04/13/10,2008  . UPPER GASTROINTESTINAL ENDOSCOPY  12/29/10    Allergies: Allergies  Allergen Reactions  . Codeine Nausea Only  . Levaquin [Levofloxacin In D5w] Nausea And Vomiting and Other (See Comments)    CAUSED BLINDNESS, SKIN SENSITIVE TO TOUCH  . Septra [Sulfamethoxazole-Trimethoprim] Other (See Comments)    unknown  . Sulfa Antibiotics Nausea Only      Home Medications:  Current Outpatient Medications:  .  amitriptyline (ELAVIL) 10 MG tablet, Take 10 mg by mouth. Patient takes 10 mg with breakfast , lunch and dinner., Disp: , Rfl:  .  amitriptyline (ELAVIL) 50 MG tablet, Take 50 mg by mouth at bedtime., Disp: , Rfl:  .  amLODipine (NORVASC) 5 MG tablet, Take 2.5 mg by mouth daily. , Disp: , Rfl:  .  atorvastatin (LIPITOR) 10 MG tablet, Take 10 mg by mouth daily., Disp: , Rfl:  .  Calcium Carb-Cholecalciferol (CALCIUM 600 + D) 600-200 MG-UNIT TABS, Take 1 tablet by mouth daily. , Disp: , Rfl:  .  Cholecalciferol (VITAMIN D-3) 1000 UNITS CAPS, Take by mouth daily., Disp: , Rfl:  .  Cyanocobalamin (VITAMIN B 12 PO), Take 2,500 mcg by mouth daily., Disp: , Rfl:  .  cycloSPORINE (RESTASIS) 0.05  % ophthalmic emulsion, Place 1 drop into both eyes 2 (two) times daily., Disp: , Rfl:  .  denosumab (PROLIA) 60 MG/ML SOLN injection, Inject 60 mg into the skin every 6 (six) months. Administer in upper arm, thigh, or abdomen, Disp: , Rfl:  .  DULoxetine (CYMBALTA) 60 MG capsule, Take 60 mg by mouth 2 (two) times daily., Disp: , Rfl:  .  Glucosamine-Chondroitin-MSM (GLUCOSAMINE CHONDROIT MSM DS PO), Take 1,500 mg by mouth 2 (two) times daily., Disp: , Rfl:  .  HYDROcodone-acetaminophen (NORCO/VICODIN) 5-325 MG tablet, Take 1 tablet by mouth. Patient takes 1 1/2 daily three times daily., Disp: , Rfl:  .  levocetirizine (XYZAL) 5 MG tablet, Take 5 mg by mouth every evening., Disp: , Rfl:  .  mirtazapine (REMERON) 30 MG tablet, Take 30 mg by mouth at bedtime. , Disp: , Rfl:  .  montelukast (SINGULAIR)  10 MG tablet, Take 10 mg by mouth at bedtime., Disp: , Rfl:  .  Multiple Vitamins-Minerals (MULTI FOR HER PO), Take 1 tablet by mouth daily. , Disp: , Rfl:  .  Omega-3 Fatty Acids (FISH OIL) 1200 MG CAPS, Take 2 capsules (2,400 mg total) by mouth daily. Patient states that she takes 5 daily, Disp: , Rfl:  .  polyethylene glycol (MIRALAX / GLYCOLAX) packet, Take 17 g by mouth daily as needed for mild constipation., Disp: , Rfl:  .  sennosides-docusate sodium (SENOKOT-S) 8.6-50 MG tablet, Take 3-6 tablets by mouth at bedtime. Patient states that she takes 3- 6 per night, Disp: , Rfl:  .  Aclidinium Bromide (TUDORZA PRESSAIR) 400 MCG/ACT AEPB, Inhale 1 puff into the lungs 2 (two) times daily., Disp: , Rfl:  .  ALPRAZolam (XANAX) 1 MG tablet, Take 0.5 mg by mouth 4 (four) times daily. Patient take 1/2 tablet 3-4 times daily, Disp: , Rfl:  .  bimatoprost (LUMIGAN) 0.03 % ophthalmic solution, Place 1 drop into both eyes at bedtime., Disp: , Rfl:  .  ciclesonide (OMNARIS) 50 MCG/ACT nasal spray, Place 2 sprays into both nostrils daily., Disp: , Rfl:  .  CROMOLYN SODIUM IN, Inhale into the lungs 2 (two) times  daily. 20 mg/74mL, Disp: , Rfl:  .  diclofenac sodium (VOLTAREN) 1 % GEL, Apply 4 g topically as needed (Pain). , Disp: , Rfl:  .  esomeprazole (NEXIUM) 40 MG capsule, Take 40 mg by mouth daily at 12 noon., Disp: , Rfl:  .  IPRATROPIUM BROMIDE HFA IN, Inhale 2 puffs into the lungs 4 (four) times daily. 2 puffs  4 times daily, Disp: , Rfl:  .  ondansetron (ZOFRAN) 4 MG tablet, TAKE 1 TABLET (4 MG TOTAL) BY MOUTH 3 (THREE) TIMES DAILY as needed (Patient not taking: Reported on 08/13/2019), Disp: 30 tablet, Rfl: 0 .  pantoprazole (PROTONIX) 40 MG tablet, Take 1 tablet (40 mg total) by mouth daily., Disp: 30 tablet, Rfl: 6 .  sucralfate (CARAFATE) 1 G tablet, Take 1 tablet (1 g total) by mouth 4 (four) times daily -  with meals and at bedtime. (Patient not taking: Reported on 08/13/2019), Disp: 120 tablet, Rfl: 1 .  traMADol-acetaminophen (ULTRACET) 37.5-325 MG per tablet, Take 1-2 tablets by mouth every 6 (six) hours as needed for moderate pain or severe pain., Disp: , Rfl:    Family History: family history includes Alzheimer's disease in her mother; Cystic fibrosis in her daughter; Fibromyalgia in her daughter and son; Healthy in her daughter; Heart disease in her father; Lupus in her brother.    Social History:   reports that she has never smoked. She has never used smokeless tobacco. She reports that she does not drink alcohol or use drugs.   Review of Systems: Constitutional: Denies weight loss/weight gain  Eyes: No changes in vision. ENT: No oral lesions, sore throat.  GI: see HPI.  Heme/Lymph: No easy bruising.  CV: No chest pain.  GU: No hematuria.  Integumentary: No rashes.  Neuro: No headaches.  Psych: No depression/anxiety.  Endocrine: No heat/cold intolerance.  Allergic/Immunologic: No urticaria.  Resp: No cough, SOB.  Musculoskeletal: No joint swelling.    Physical Examination: BP 132/77 (BP Location: Right Arm, Patient Position: Sitting, Cuff Size: Large)   Pulse (!) 101    Temp 98 F (36.7 C) (Temporal)   Ht 5\' 5"  (1.651 m)   Wt 133 lb (60.3 kg)   BMI 22.13 kg/m  Gen: NAD, alert and  oriented x 4 HEENT: PEERLA, EOMI, Neck: supple, no JVD Chest: CTA bilaterally, no wheezes, crackles, or other adventitious sounds CV: RRR, no m/g/c/r Abd: soft, NT, ND, +BS in all four quadrants; no HSM, guarding, ridigity, or rebound tenderness Rectal - external hemorrhoid with excoriation, non tender DRE, hemoccult negative stool  Ext: no edema, well perfused with 2+ pulses, Skin: no rash or lesions noted on observed skin Lymph: no noted LAD  Data:  Requesting blood work labs she had done in [place].  None included in referral order recent labs in epic  Assessment/Plan: Ms. Bredeson is a 86 y.o. female  Marycruz was seen today for new patient (initial visit).  Diagnoses and all orders for this visit:  Iron deficiency anemia, unspecified iron deficiency anemia type -     CBC with Differential -     Fe+TIBC+Fer  Rectal bleeding -     CBC with Differential -     Fe+TIBC+Fer  Dyspepsia -     CBC with Differential -     Fe+TIBC+Fer  Other orders -     pantoprazole (PROTONIX) 40 MG tablet; Take 1 tablet (40 mg total) by mouth daily.      1.  Rectal bleeding-last approximately 2 months ago and has spontaneously resolved.  She had CBC done when bleeding was occurring per patient was stable.  We will request copy of this today as well as repeat her CBC.  Suspect anal outlet bleeding.  Her rectal exam was Hemoccult negative without obvious blood.  Given symptoms have completely resolved and she has no other lower GI alarm symptoms we will hold off on colonoscopy at age of 66 unless bleeding returns.  She is to contact me if this occurs, alarm symptoms reviewed.   2.  Nausea/epigastric pain-she was using NSAIDs daily prior to pain.  Suspect some underlying gastritis or PUD.  We will start her on pantoprazole 40 mg once a day sent to pharmacy.  She will follow up with few  weeks, if not better will evaluate further.  We will get a copy of the blood work she has had done.  F/up 1-2 months, to call sooner if needed.  I personally performed the service, non-incident to. (WP)  Tawni Pummel, [facility]

## 2019-08-17 ENCOUNTER — Telehealth (INDEPENDENT_AMBULATORY_CARE_PROVIDER_SITE_OTHER): Payer: Self-pay | Admitting: Gastroenterology

## 2019-08-17 NOTE — Telephone Encounter (Signed)
It looks like the patient had the requested labs drawn on the 4 th of February. Everything but the TIBC. They are in Epic under what you ordered. Please advise.

## 2019-08-17 NOTE — Telephone Encounter (Signed)
Linda Adkins had labs done when the office did not have power to electronically order labs at that time.  She took a paper order to the lab and I put in lab orders into epic after.  She was supposed to have labs on Thursday, February 4-I would expect these back by now.  Can you check with lab on this? Thanks!

## 2019-08-17 NOTE — Telephone Encounter (Signed)
Forwarded to Tammy

## 2019-08-17 NOTE — Telephone Encounter (Signed)
Reviewed-I called to let patient know her labs are stable.  No answer.  Left voicemail on machine.  [name]-she was here when the power was out and did not get to make follow-up appointment for around 2 to 3 months at end of office visit - please call to set up. Thanks.

## 2019-10-16 ENCOUNTER — Telehealth (INDEPENDENT_AMBULATORY_CARE_PROVIDER_SITE_OTHER): Payer: Self-pay | Admitting: Internal Medicine

## 2019-10-16 NOTE — Telephone Encounter (Signed)
Arline Asp from [facility] would like office notes from office visit on 2/4 faxed to Dr Leeroy Bock at [phone]

## 2019-10-19 NOTE — Telephone Encounter (Signed)
Notes faxed to Dr Celine Mans

## 2019-12-29 ENCOUNTER — Ambulatory Visit (INDEPENDENT_AMBULATORY_CARE_PROVIDER_SITE_OTHER): Payer: Medicare Other | Admitting: Internal Medicine
